# Patient Record
Sex: Female | Born: 1945
Health system: Southern US, Community
[De-identification: ages and names within clinical notes are randomized; demographics above are authoritative.]

## PROBLEM LIST (undated history)

## (undated) DIAGNOSIS — K219 Gastro-esophageal reflux disease without esophagitis: Secondary | ICD-10-CM

## (undated) HISTORY — PX: ADENOIDECTOMY: SUR15

## (undated) HISTORY — PX: TONSILLECTOMY: SUR1361

## (undated) HISTORY — DX: Gastro-esophageal reflux disease without esophagitis: K21.9

---

## 1998-01-16 ENCOUNTER — Ambulatory Visit: Admission: RE | Admit: 1998-01-16 | Discharge: 1998-01-16 | Payer: Self-pay | Admitting: Family Medicine

## 1999-05-06 ENCOUNTER — Other Ambulatory Visit: Admission: RE | Admit: 1999-05-06 | Discharge: 1999-05-06 | Payer: Self-pay | Admitting: Family Medicine

## 2000-07-05 ENCOUNTER — Encounter: Payer: Self-pay | Admitting: Family Medicine

## 2000-07-05 ENCOUNTER — Encounter: Admission: RE | Admit: 2000-07-05 | Discharge: 2000-07-05 | Payer: Self-pay | Admitting: Family Medicine

## 2000-07-19 ENCOUNTER — Other Ambulatory Visit: Admission: RE | Admit: 2000-07-19 | Discharge: 2000-07-19 | Payer: Self-pay | Admitting: Family Medicine

## 2000-08-17 ENCOUNTER — Ambulatory Visit (HOSPITAL_COMMUNITY): Admission: RE | Admit: 2000-08-17 | Discharge: 2000-08-17 | Payer: Self-pay | Admitting: *Deleted

## 2000-08-18 ENCOUNTER — Ambulatory Visit (HOSPITAL_COMMUNITY): Admission: RE | Admit: 2000-08-18 | Discharge: 2000-08-18 | Payer: Self-pay | Admitting: *Deleted

## 2004-01-01 ENCOUNTER — Ambulatory Visit (HOSPITAL_COMMUNITY): Admission: RE | Admit: 2004-01-01 | Discharge: 2004-01-01 | Payer: Self-pay | Admitting: Internal Medicine

## 2005-01-06 ENCOUNTER — Ambulatory Visit: Payer: Self-pay | Admitting: Internal Medicine

## 2005-03-17 ENCOUNTER — Ambulatory Visit: Payer: Self-pay | Admitting: Internal Medicine

## 2005-04-02 ENCOUNTER — Ambulatory Visit: Payer: Self-pay | Admitting: Internal Medicine

## 2005-04-19 ENCOUNTER — Ambulatory Visit: Payer: Self-pay | Admitting: Internal Medicine

## 2006-02-10 ENCOUNTER — Ambulatory Visit: Payer: Self-pay | Admitting: Internal Medicine

## 2006-05-13 ENCOUNTER — Ambulatory Visit: Payer: Self-pay | Admitting: Internal Medicine

## 2006-08-11 ENCOUNTER — Ambulatory Visit: Payer: Self-pay | Admitting: Internal Medicine

## 2006-09-02 ENCOUNTER — Ambulatory Visit: Payer: Self-pay | Admitting: Internal Medicine

## 2007-04-21 ENCOUNTER — Ambulatory Visit: Payer: Self-pay | Admitting: Internal Medicine

## 2007-06-22 DIAGNOSIS — K219 Gastro-esophageal reflux disease without esophagitis: Secondary | ICD-10-CM | POA: Insufficient documentation

## 2007-06-22 DIAGNOSIS — J4550 Severe persistent asthma, uncomplicated: Secondary | ICD-10-CM | POA: Insufficient documentation

## 2007-06-23 ENCOUNTER — Ambulatory Visit: Payer: Self-pay | Admitting: Internal Medicine

## 2007-07-11 ENCOUNTER — Other Ambulatory Visit: Admission: RE | Admit: 2007-07-11 | Discharge: 2007-07-11 | Payer: Self-pay | Admitting: Family Medicine

## 2007-07-19 ENCOUNTER — Encounter: Admission: RE | Admit: 2007-07-19 | Discharge: 2007-07-19 | Payer: Self-pay | Admitting: Family Medicine

## 2007-08-11 ENCOUNTER — Ambulatory Visit: Payer: Self-pay | Admitting: Internal Medicine

## 2007-08-11 ENCOUNTER — Telehealth (INDEPENDENT_AMBULATORY_CARE_PROVIDER_SITE_OTHER): Payer: Self-pay | Admitting: *Deleted

## 2007-08-18 ENCOUNTER — Encounter: Admission: RE | Admit: 2007-08-18 | Discharge: 2007-08-18 | Payer: Self-pay | Admitting: Family Medicine

## 2008-07-29 ENCOUNTER — Ambulatory Visit: Payer: Self-pay | Admitting: Internal Medicine

## 2009-09-03 ENCOUNTER — Ambulatory Visit: Payer: Self-pay | Admitting: Internal Medicine

## 2010-01-15 ENCOUNTER — Telehealth (INDEPENDENT_AMBULATORY_CARE_PROVIDER_SITE_OTHER): Payer: Self-pay | Admitting: *Deleted

## 2010-02-02 ENCOUNTER — Telehealth (INDEPENDENT_AMBULATORY_CARE_PROVIDER_SITE_OTHER): Payer: Self-pay | Admitting: *Deleted

## 2010-02-06 ENCOUNTER — Telehealth (INDEPENDENT_AMBULATORY_CARE_PROVIDER_SITE_OTHER): Payer: Self-pay | Admitting: *Deleted

## 2010-09-22 NOTE — Miscellaneous (Signed)
Summary: Orders Update pft charges  Clinical Lists Changes  Orders: Added new Service order of Carbon Monoxide diffusing w/capacity (94720) - Signed Added new Service order of Lung Volumes (94240) - Signed Added new Service order of Spirometry (Pre & Post) (94060) - Signed 

## 2010-09-22 NOTE — Progress Notes (Signed)
Summary: RX LARIFICATION  Phone Note Call from Patient   Caller: NANCY @ [facility] [address] Call For: WERT Summary of Call: RX IS FOR XOPENEX AND SYMBICORT. DOES PT NEED BOTH OF THESE? [phone]. CHART READS TO WERT SCHED 12/19. Initial call taken by: Tivis Ringer,  August 11, 2007 11:18 AM  Follow-up for Phone Call        MW rx both Symbicort and xopenex.  Symbicort is a maintance med and the xopenex is for relief purposes only.  Called pharmacy spoke with pharmacist made aware. Follow-up by: Cloyde Reams RN,  August 11, 2007 11:28 AM

## 2010-09-22 NOTE — Progress Notes (Signed)
Summary: prescript  Phone Note Call from Patient   Caller: Patient Call For: wert Summary of Call: need prescript for xopenex called [facility] [address] Initial call taken by: Rickard Patience,  February 06, 2010 3:59 PM  Follow-up for Phone Call        Rx was sent to pharm.  Spoke with pt and advised this was done. Follow-up by: Vernie Murders,  February 06, 2010 4:05 PM    Prescriptions: Pauline Aus HFA 45 MCG/ACT  AERO (LEVALBUTEROL TARTRATE) 2 puffs every 4h as needed  #1 x 3   Entered by:   Vernie Murders   Authorized by:   Nyoka Cowden MD   Signed by:   Vernie Murders on 02/06/2010   Method used:   Electronically to        [facility]  [address] [phone]* (retail)       [address]       [place], [place]  [address]       Ph: [phone] or [phone]       Fax: [phone]   RxID:   [id number]

## 2010-09-22 NOTE — Assessment & Plan Note (Signed)
Summary: Pulmonary/ acute ext ov with hfa teaching   Primary Provider/Referring Provider:  Dr. Ocie Bob  CC:  Yearly followup.  Pt c/o increased SOB and prod cough with yellow sputum x 5 days.  Pt denies any other complaints today.Marland Kitchen  History of Present Illness: 65 year old white female with severe chronic asthma with an irreversible component on her best days has an FEV1 of approximately 1.2.    12/7/9  ov for yearly reeval with no cough or increased dyspnea over baseline.    September 03, 2009  Yearly followup.  Pt c/o increased SOB and prod cough with yellow sputum x 5 days.  was maintaining nicely on twice daily symbicort and qvar but exposed to sick grandchildren started with head cold > chest > increased saba to every 4-6 hours which is a marked change.  Pt denies any significant sore throat, dysphagia, itching, sneezing,  fever, chills, sweats, unintended wt loss, pleuritic or exertional cp, hempoptysis, change in activity tolerance  orthopnea pnd or leg swelling.    Current Medications (verified): 1)  Symbicort 160-4.5 Mcg/act  Aero (Budesonide-Formoterol Fumarate) .... Two Puffs Twice A Day 2)  Qvar 80 Mcg/act  Aers (Beclomethasone Dipropionate) .... Two Puffs Twice A Day 3)  Multivitamins   Tabs (Multiple Vitamin) .... Once Daily 4)  Calcium 600 Mg .... Once Daily 5)  Niacin Cr 250 Mg  Cpcr (Niacin) .... Once Daily 6)  Singulair 10 Mg  Tabs (Montelukast Sodium) .... Once Daily 7)  Nexium 40 Mg  Cpdr (Esomeprazole Magnesium) .... Once Daily 8)  New Phase .... As Directed 9)  Xopenex Hfa 45 Mcg/act  Aero (Levalbuterol Tartrate) .... 2 Puffs Every 4h As Needed 10)  Robitussin Dm 100-10 Mg/57ml Syrp (Dextromethorphan-Guaifenesin) .... Per Bottle Directions As Needed  Allergies (verified): 1)  ! Pcn 2)  ! Sulfa  Past History:  Past Medical History:  GASTROESOPHAGEAL REFLUX DISEASE (ICD-530.81) ASTHMA (ICD-493.90)     - pft's 08/11/2007  FEV1 1.15 ratio 43% with 13% improvement  after bronchodilators and nl DLC0     - nl alpha1AT 05/13/2006     - HFA 75% September 03, 2009  Medication calendar completed 8/06  Vital Signs:  Patient profile:   65 year old female Weight:      173.13 pounds BMI:     31.78 O2 Sat:      97 % on Room air Temp:     97.6 degrees F oral Pulse rate:   79 / minute BP sitting:   122 / 80  (left arm)  Vitals Entered By: Vernie Murders (September 03, 2009 1:50 PM)  O2 Flow:  Room air  Physical Exam  Additional Exam:  wt 175 > 173 September 03, 2009  Ambulatory healthy appearing in no acute distress. Afeb with normal vital signs HEENT: nl dentition, turbinates, and orophanx. Nl external ear canals without cough reflex Neck without JVD/Nodes/TM Lungs slightly diminished breath sounds with increased expiratory time and bilateral trace mid exp  wheezes. RRR no s3 or murmur or increase in P2 Abd soft and benign with nl excursion in the supine position. No bruits or organomegaly Ext warm without calf tenderness, cyanosis clubbing or edema Skin warm and dry without lesions     Impression & Recommendations:  Problem # 1:  ASTHMA (ICD-493.90) Mild flare in setting of uri - her chronic maint rx seems quite appropriate.   Each maintenance medication was reviewed in detail including most importantly the difference between maintenance and as needed and under  what circumstances the prns are to be used. See instructions for specific recommendations   I spent extra time with the patient today explaining optimal mdi  technique.  This improved from  50-75%  Medications Added to Medication List This Visit: 1)  Nexium 40 Mg Cpdr (Esomeprazole magnesium) .... Take  one 30-60 min before first meal of the day 2)  Robitussin Dm 100-10 Mg/87ml Syrp (Dextromethorphan-guaifenesin) .... Per bottle directions as needed 3)  Prednisone 10 Mg Tabs (Prednisone) .... 4 each am x 2days, 2x2days, 1x2days and stop 4)  Doxycycline Hyclate 100 Mg Tabs (Doxycycline hyclate)  .... One twice daily before eating  Other Orders: Est. Patient Level IV (81191)  Patient Instructions: 1)  Work on inhaler technique:  relax and blow all the way out then take a nice smooth deep breath back in, triggering the inhaler at same time you start breathing in 2)  doxy x 7 days should turn the mucus back to clear 3)  Prednisone 10 mg 4 each am x 2days, 2x2days, 1x2days and stop should improve your breathing  back to where it was and reuslt in less need for rescue therapy with xopenex 4)  If your breathing worsens or you need to use your rescue inhaler more than twice weekly or wake up more than twice a month with any respiratory symptoms or require more than two rescue inhalers per year, we need to see you right away. 5)    Prescriptions: DOXYCYCLINE HYCLATE 100 MG TABS (DOXYCYCLINE HYCLATE) one twice daily before eating  #14 x 0   Entered and Authorized by:   Nyoka Cowden MD   Signed by:   Nyoka Cowden MD on 09/03/2009   Method used:   Electronically to        [facility] [phone]* (retail)       [address]       [place], [place]  [address]       Ph: [phone] or [phone]       Fax: [phone]   RxID:   [id number] PREDNISONE 10 MG  TABS (PREDNISONE) 4 each am x 2days, 2x2days, 1x2days and stop  #14 x 0   Entered and Authorized by:   Nyoka Cowden MD   Signed by:   Nyoka Cowden MD on 09/03/2009   Method used:   Electronically to        [facility]  [address] [phone]* (retail)       [address]       [place], [place]  [address]       Ph: [phone] or [phone]       Fax: [phone]   RxID:   [id number]

## 2010-09-22 NOTE — Assessment & Plan Note (Signed)
Summary: 6 WEEKS/APC   Chief Complaint:  Pt here for rov with PFT's She has no new complaints..  History of Present Illness: 65 year old white female with severe chronic asthma with an irreversible component two on her best days has an FEV1 of approximately 1.2.  She returns today as requested for PFTs stating that she has had no increase in dyspnea over baseline. Pt denies any significant sore throat, nasal congestion or excess secretions, fever, chills, sweats, unintended wt loss, ex cp, orthopnea pnd or leg swelling. Pt denies any increase in rescue therapy over baseline, denies waking up needing it or having early am exacerbations of coughing/wheezing/ or dyspnea.     Current Allergies: ! PCN ! SULFA  Past Medical History:    Current Problems:     GASTROESOPHAGEAL REFLUX DISEASE (ICD-530.81)    ASTHMA (ICD-493.90)     see pft's 08/11/2007     nl alpha1AT 05/13/2006              Vital Signs:  Patient Profile:   65 Years Old Female Height:     62 inches Weight:      178 pounds O2 Sat:      97 % Temp:     99.5 degrees F oral Pulse rate:   77 / minute Oxygen therapy Room Air                 Physical Exam  Ambulatory bf in nad mild turbinate edema.  Dentures in place. Oropharynx no thrush or excess pnd or cobblestoning.  No JVD or cervical adenopathy. Mild accessory muscle hypertrophy. Trachea midline, nl thryroid. Chest was hyperinflated by percussion with diminished breath sounds and moderate increased exp time without wheeze. Hoover sign positive at mid inspiration. Regular rate and rhythm without murmur gallop or rub or increase P2.  Abd: no hsm, nl excursion. Ext warm without C,C or E.       Problem # 1:  ASTHMA (ICD-493.90) I had an extended discussion with the patient today lasting 15 to 20 minutes of a 25 minute visit on the following issues:   First I reviewed with her her PFTs from today to indicate she still has severe focal structure with no  significant improvement in her baseline FEV1.  This puts her at risk of flareups although does not appear to interfere with her ADLs, perhaps because she has learned to pace herself.  She also has become somewhat complacent in terms of optimal inhaler technique, in fact, has regressed in terms her of her ability to use these effectively to my chagrin.  I spent her time with the patient today explaining optimal mdi  technique.  This improved from 25 to 75% effective.  I also emphasized the 3-5 day rule for exacerbations and offered to see her here for any flare ups to avoid trips to er or unnecessary work absence (she works nights so daytime visits before or after work should solve this problem if she'll just pay more attention to her symptoms and her response or need for rescue therapy.  Problem # 2:  GASTROESOPHAGEAL REFLUX DISEASE (ICD-530.81)  Her updated medication list for this problem includes:    Nexium 40 Mg Cpdr (Esomeprazole magnesium) .Marland Kitchen... Take  one 30-60 min before first meal of the day    Medications Added to Medication List This Visit: 1)  New Phase  .... As directed 2)  Xopenex Hfa 45 Mcg/act Aero (Levalbuterol tartrate) .... 2 puffs every 4h as needed   Patient  Instructions: 1)  Please schedule a follow-up appointment in 3 months. 2)  Remember to trigger inhaler immediately when you start to breath in and continue to breath slow and deep   Prescriptions: XOPENEX HFA 45 MCG/ACT  AERO (LEVALBUTEROL TARTRATE) 2 puffs every 4h as needed  #1 x 11   Entered and Authorized by:   Nyoka Cowden MD   Signed by:   Nyoka Cowden MD on 08/11/2007   Method used:   Electronically sent to ...       [facility] [address] [phone]*       [address]       [place], [place]  [address]       Ph: [phone] or [phone]       Fax: [phone]   RxID:   [id number] QVAR 80 MCG/ACT  AERS (BECLOMETHASONE DIPROPIONATE) TWO PUFFS TWICE A DAY   #1 x 11   Entered and Authorized by:   Nyoka Cowden MD   Signed by:   Nyoka Cowden MD on 08/11/2007   Method used:   Electronically sent to ...       [facility] [address] [phone]*       [address]       [place], [place]  [address]       Ph: [phone] or [phone]       Fax: [phone]   RxID:   [id number] SYMBICORT 160-4.5 MCG/ACT  AERO (BUDESONIDE-FORMOTEROL FUMARATE) TWO PUFFS TWICE A DAY  #1 x 12   Entered and Authorized by:   Nyoka Cowden MD   Signed by:   Nyoka Cowden MD on 08/11/2007   Method used:   Electronically sent to ...       [facility] [address] [phone]*       [address]       [place], [place]  [address]       Ph: [phone] or [phone]       Fax: [phone]   RxID:   [id number]  ]

## 2010-09-22 NOTE — Progress Notes (Signed)
  Phone Note Other Incoming   Request: Send information Summary of Call: Request for records received from [facility]. Request forwarded to [facility].     

## 2010-09-22 NOTE — Assessment & Plan Note (Signed)
Summary: Pulmonary/ yearly fu asthma   PCP:  Dr. Ocie Bob  Chief Complaint:  Pt returns for followup.  She was last seen 08-11-07.  Breathing overall okay.  No complaints today.Suzanne Harvey  History of Present Illness: 65 year old white female with severe chronic asthma with an irreversible component on her best days has an FEV1 of approximately 1.2.    12/7/9  ov for yearly reeval with no cough or increased dyspnea over baseline.  Pt denies any significant sore throat, nasal congestion or excess secretions, fever, chills, sweats, unintended wt loss, pleuritic or exertional cp, orthopnea pnd or leg swelling.  Pt also denies any obvious fluctuation in symptoms with weather or environmental change or other alleviating or aggravating factors.     Pt denies any increase in rescue therapy over baseline, denies waking up needing it or having early am exacerbations of coughing/wheezing/ or dyspnea      Updated Prior Medication List: SYMBICORT 160-4.5 MCG/ACT  AERO (BUDESONIDE-FORMOTEROL FUMARATE) TWO PUFFS TWICE A DAY QVAR 80 MCG/ACT  AERS (BECLOMETHASONE DIPROPIONATE) TWO PUFFS TWICE A DAY MULTIVITAMINS   TABS (MULTIPLE VITAMIN) once daily * CALCIUM 600 MG ONCE DAILY NIACIN CR 250 MG  CPCR (NIACIN) once daily SINGULAIR 10 MG  TABS (MONTELUKAST SODIUM) once daily NEXIUM 40 MG  CPDR (ESOMEPRAZOLE MAGNESIUM) once daily * NEW PHASE as directed XOPENEX HFA 45 MCG/ACT  AERO (LEVALBUTEROL TARTRATE) 2 puffs every 4h as needed  Current Allergies (reviewed today): ! PCN ! SULFA  Past Medical History:     GASTROESOPHAGEAL REFLUX DISEASE (ICD-530.81)    ASTHMA (ICD-493.90)        - pft's 08/11/2007  FEV1 1.15 ratio 43% with 13% improvement after bronchodilators and nl DLC0        - nl alpha1AT 05/13/2006    Medication calendar completed 8/06               Family History:    ht dz father    cva father    abd ca mother    asthma 2 children  Social History:    never smoker    Review of  Systems  The patient denies anorexia, fever, weight loss, weight gain, vision loss, decreased hearing, hoarseness, chest pain, syncope, peripheral edema, prolonged cough, headaches, hemoptysis, abdominal pain, melena, hematochezia, severe indigestion/heartburn, hematuria, incontinence, muscle weakness, suspicious skin lesions, transient blindness, difficulty walking, depression, unusual weight change, abnormal bleeding, enlarged lymph nodes, and angioedema.     Vital Signs:  Patient Profile:   65 Years Old Female Height:     62 inches Weight:      175 pounds O2 Sat:      98 % O2 treatment:    Room Air Temp:     98.1 degrees F oral Pulse rate:   66 / minute BP sitting:   132 / 74  (left arm)  Vitals Entered By: Vernie Murders (July 29, 2008 2:25 PM)                 Physical Exam  wt 178 > 175 Ambulatory healthy appearing in no acute distress. Afeb with normal vital signs HEENT: nl dentition, turbinates, and orophanx. Nl external ear canals without cough reflex Neck without JVD/Nodes/TM Lungs slightly diminished breath sounds with increased expiratory time but no true wheezes. RRR no s3 or murmur or increase in P2 Abd soft and benign with nl excursion in the supine position. No bruits or organomegaly Ext warm without calf tenderness, cyanosis clubbing or edema Skin warm  and dry without lesions        Problem # 1:  ASTHMA (ICD-493.90) I spent extra time with the patient today explaining optimal mdi  technique.  This improved from  50-75%  All goals of asthma met including optimal function and elimination of symptoms with minimum need for rescue therapy. Contingencies discussed today including the rule of two's.    Each maintenance medication was reviewed in detail including most importantly the difference between maintenance and as needed and under what circumstances the prns are to be used.  Her updated medication list for this problem includes:    Symbicort 160-4.5  Mcg/act Aero (Budesonide-formoterol fumarate) .Suzanne Harvey..Suzanne Harvey Two puffs twice a day    Qvar 80 Mcg/act Aers (Beclomethasone dipropionate) .Suzanne Harvey..Suzanne Harvey Two puffs twice a day    Singulair 10 Mg Tabs (Montelukast sodium) ..... Once daily    Xopenex Hfa 45 Mcg/act Aero (Levalbuterol tartrate) .Suzanne Harvey... 2 puffs every 4h as needed  Orders: Est. Patient Level IV ([address])   Medications Added to Medication List This Visit: 1)  Symbicort 160-4.5 Mcg/act Aero (Budesonide-formoterol fumarate) .... Two puffs twice a day 2)  Qvar 80 Mcg/act Aers (Beclomethasone dipropionate) .... Two puffs twice a day  Complete Medication List: 1)  Symbicort 160-4.5 Mcg/act Aero (Budesonide-formoterol fumarate) .... Two puffs twice a day 2)  Qvar 80 Mcg/act Aers (Beclomethasone dipropionate) .... Two puffs twice a day 3)  Multivitamins Tabs (Multiple vitamin) .... Once daily 4)  Calcium 600 Mg  .... Once daily 5)  Niacin Cr 250 Mg Cpcr (Niacin) .... Once daily 6)  Singulair 10 Mg Tabs (Montelukast sodium) .... Once daily 7)  Nexium 40 Mg Cpdr (Esomeprazole magnesium) .... Once daily 8)  New Phase  .... As directed 9)  Xopenex Hfa 45 Mcg/act Aero (Levalbuterol tartrate) .... 2 puffs every 4h as needed   Patient Instructions: 1)  work on inhaler technique smooth and deep 2)  If your breathing worsens or you need to use your rescue inhaler(xopenox)  more than twice weekly or wake up more than twice a month with any respiratory symptoms or require more than two rescue inhalers per year, we need to see you right away. 3)  otherwise we will see you in one year for refills 4)      Prescriptions: QVAR 80 MCG/ACT  AERS (BECLOMETHASONE DIPROPIONATE) TWO PUFFS TWICE A DAY  #1 x 11   Entered and Authorized by:   Nyoka Cowden MD   Signed by:   Nyoka Cowden MD on 07/29/2008   Method used:   Electronically to        [facility]  [address] [phone]* (retail)       [address]       [place], [place]   [address]       Ph: [phone] or [phone]       Fax: [phone]   RxID:   [id number] SYMBICORT 160-4.5 MCG/ACT  AERO (BUDESONIDE-FORMOTEROL FUMARATE) TWO PUFFS TWICE A DAY  #1 x 11   Entered and Authorized by:   Nyoka Cowden MD   Signed by:   Nyoka Cowden MD on 07/29/2008   Method used:   Electronically to        [facility]  [address] [phone]* (retail)       [address]       [place], [place]  [address]  Ph: [phone] or [phone]       Fax: [phone]   RxID:   [id number]  ]

## 2010-09-22 NOTE — Progress Notes (Signed)
  Phone Note Other Incoming   Request: Send information Summary of Call: Request for records received from [facility]. Request forwarded to [facility].      Appended Document:  signed in error

## 2010-10-30 ENCOUNTER — Ambulatory Visit
Admission: RE | Admit: 2010-10-30 | Discharge: 2010-10-30 | Disposition: A | Discharge status: Home / Self Care | Payer: BC Managed Care – PPO | Source: Ambulatory Visit | Attending: Rheumatology | Admitting: Rheumatology

## 2010-10-30 ENCOUNTER — Other Ambulatory Visit: Payer: Self-pay | Admitting: Rheumatology

## 2010-10-30 DIAGNOSIS — M545 Low back pain, unspecified: Secondary | ICD-10-CM

## 2010-11-25 ENCOUNTER — Other Ambulatory Visit: Payer: Self-pay | Admitting: Rheumatology

## 2010-11-25 DIAGNOSIS — M545 Low back pain, unspecified: Secondary | ICD-10-CM

## 2010-11-26 ENCOUNTER — Ambulatory Visit
Admission: RE | Admit: 2010-11-26 | Discharge: 2010-11-26 | Disposition: A | Discharge status: Home / Self Care | Payer: BC Managed Care – PPO | Source: Ambulatory Visit | Attending: Rheumatology | Admitting: Rheumatology

## 2010-11-26 DIAGNOSIS — M545 Low back pain, unspecified: Secondary | ICD-10-CM

## 2011-01-05 NOTE — Assessment & Plan Note (Signed)
[facility]                             PULMONARY OFFICE NOTE   Suzanne Harvey, Suzanne Harvey                        MRN:          [id number]  DATE:06/23/2007                            DOB:          06-25-1946    PULMONARY-EXTENDED FOLLOWUP OFFICE VISIT:  A 65 year old black female  with chronic asthma stating that she has had difficulty with increasing  dyspnea with exertion over the last several weeks.  She denies any  nocturnal episodes of dyspnea, fever, chills, sweats, chest pain, overt  sinus or reflux symptoms.   For full calendar of medications, please see face sheet dated June 23, 2007.  Note that I did not realize that she has her schedule  backwards and that she sleeps in the morning and wakes up around the  afternoon or evening to go to work.  Therefore, the medication calendar  she has is not relevant based on the times of day the medicines are  indicated; however, they are inventoried on our face sheet dated June 23, 2007.   PHYSICAL EXAMINATION:  GENERAL:  She is a pleasant black female in no  acute distress.  VITAL SIGNS:  Stable.  HEENT:  Unremarkable.  Oropharynx clear with no thrush.  NECK:  Supple without cervical adenopathy or tenderness.  Trachea is  midline.  LUNGS:.  Lung fields reveal diminished breath sounds with relatively  short inspiratory time.  CARDIAC:  Regular rate and rhythm without murmur, gallop, or rub.  ABDOMEN:  Soft, nontender, nondistended.  EXTREMITIES:  Warm without calf tenderness, cyanosis, clubbing, or  edema.   IMPRESSION:  Chronic asthma with an irreversible component.  I found out  today that she actually is no longer inhaling with 75% effectiveness her  MDIs.  I spent almost 30 minutes with her in coaching and was surprised  that she has regressed, but I suspect it is because tends to  panic  and not relax all the way to residual volume before attempting to do an  inspiratory capacity.  It may  well be, therefore, that we need to switch  her over to nebulizers if this continues to be an issue.   For now, I am going to leave her on the Symbicort 160/4.5 two puffs  b.i.d. and Qvar 80 two puffs b.i.d.  She will still have Symbicort but  change the headings on her medication calendar to reflect the times of  day she actually takes her medicines.  (The columns at the top now read  when waking in the morning and at bedtime rather than the time of  day.)   In addition, I found she was due a booster for Pneumovax and gave this  to her today.  She declined the flu vaccination, however.   Followup will be in 6 weeks, sooner if needed.     Charlaine Dalton. Sherene Sires, MD, [facility]  Electronically Signed    MBW/MedQ  DD: 06/23/2007  DT: 06/24/2007  Job #: [id number]   cc:   Renaye Rakers, M.D.

## 2011-01-05 NOTE — Assessment & Plan Note (Signed)
HEALTHCARE                             PULMONARY OFFICE NOTE   Suzanne Harvey, Suzanne Harvey                        MRN:          [id number]  DATE:04/21/2007                            DOB:          Aug 05, 1946    HISTORY OF PRESENT ILLNESS:  A delightful 65 year old black female with  severe chronic asthma with a fixed airflow obstruction component, doing  a little bit worse in hot weather. She has noticed that she actually has  no symptoms at night but after she stirs around and gets busy in the  morning, she begins having symptoms and waits until then to use her  inhalers. She also has been using albuterol at least twice daily, since  the weather turned hot that is for the last several months). She denies  any exertional chest pain, orthopnea, PND, purulent sputum, overt sinus  and reflux symptoms.   MEDICATIONS:  Her present medications were reviewed with her in detail  using the medication calendar format, which corresponds to the  medication face sheet, dated April 21, 2007.   PHYSICAL EXAMINATION:  GENERAL:  An obese, pleasant, ambulatory, black  female in no acute distress.  VITAL SIGNS:  Afebrile with stable vital signs.  HEENT:  Reveals mild extremity edema. Oropharynx clear.  NECK:  Without cervical adenopathy or tenderness. Trachea is midline. No  organomegaly.  LUNGS:  Reveal diminished breath sounds without wheezing.  HEART:  Regular rate and rhythm. Without murmur, rub, or increase in P-  tube.  ABDOMEN:  Soft, benign, but obese.  EXTREMITIES:  Warm without calf tenderness, cyanosis, or clubbing.  VITAL SIGNS:  O2 saturation 98% on room air.   LABORATORY DATA:  Most recent spirometry was from a year ago and  indicates and FEV1 of 49% predicted with a ratio of 46% and no  improvement after bronchodilators.  MDI technique is reviewed and is only about 50% effective to my  surprise. Her problem is, she fails to relax to residual volume  before  triggering at the beginning of inspiration.   ASSESSMENT:  I spent an extra 15 to 20 minutes at a 25 minute visit,  therefore on the following strategy.  1. First, I made sure that she could use her MDI more effectively,      though we never got past the 75% level with her, even with multiple      attempts, because of her relatively short inspiratory time.  2. I emphasized to her that she should not wait until she is short of      breath to take the Symbicort, as it is a maintenance inhaler. Since      it works so effectively, she should probably use it about the time      her feet hit the floor in the morning, (therefore, she should leave      it by her night stand) 2 puffs before she becomes short of breath,      with relatively short inspiratory time. Will get better      distribution of that drug to target and then  when she uses the Qvar      later during her routine morning medications, she will get better      penetration with the Qvar as well with a target dose.   She seemed very happy with this approach and I gave her samples of the  Symbicort and asked to see her back in 3 months for followup PFT's and a  yearly chest x-ray.     Charlaine Dalton. Sherene Sires, MD, [facility]  Electronically Signed    MBW/MedQ  DD: 04/21/2007  DT: 04/23/2007  Job #: [id number]   cc:   Renaye Rakers, M.D.

## 2011-01-08 NOTE — Assessment & Plan Note (Signed)
[facility]                               PULMONARY OFFICE NOTE   Suzanne, Harvey                        MRN:          [id number]  DATE:05/13/2006                            DOB:          1946/01/07    The patient is a 65 year old black female who carries a diagnosis of  lifelong asthma associated with sinusitis for which she has been followed by  Dr. Pollyann Kennedy.  She comes in today stating that she had a tough summer with the  hot weather, but now that it is cooler she is feeling better.  She has no  significant limitation in terms of activities of daily living but is not  aerobically active.  She denies any nocturnal cough or wheeze or dyspnea.   For full full list of medications please see face sheet dated 05/14/2006  that corresponds 100% to her present medication calendar.   PHYSICAL EXAMINATION:  She is a pleasant black female in no acute distress.  VITAL SIGNS:  Afebrile.  HEENT:  Unremarkable. Pharynx clear.  LUNGS:  Fields are completely clear bilaterally to auscultation and  percussion.  CARDIAC:  Regular rate and rhythm without murmurs, gallops or rubs.  ABDOMEN:  Soft, benign.  EXTREMITIES:  Warm without calf tenderness, cyanosis, clubbing or edema.   PFTs were performed and to my surprise revealed further reduction in FEV1  down to 1.06 now with no improvement after bronchodilators.   Chart review indicates progressive decline in FEV1 despite since she was  first seen here in 1998 with more of a pattern of COPD than asthma.  I am  therefore going to recommend and alpha 1 antitrypsin level and entertain the  possibility of changing her regimen to add QVAR to reach the lower airways.  It will be difficult to motivate her to use additional medication since she  remains so asymptomatic and is already on a complex regimen.   PLAN:  Check the alpha 1 trypsin level.  If negative, add QVAR as the next  step.  The patient will be followed  every 3 months either way.                                   Suzanne Harvey. Suzanne Sires, MD, [facility]   MBW/MedQ  DD:  05/14/2006  DT:  05/17/2006  Job #:  [id number]   cc:   Renaye Rakers, M.D.

## 2011-01-08 NOTE — Assessment & Plan Note (Signed)
[facility]                             PULMONARY OFFICE NOTE   RAELAN, BURGOON                          MRN:          [id number]  DATE:09/02/2006                            DOB:          [birth year]    HISTORY OF PRESENT ILLNESS:  This is a 65 year old African-American  female patient of Dr. Thurston Hole. Has a known history of chronic asthma and  returns for a two week followup. Last visit patient was having a flare  of asthmatic symptoms despite being on Advair 250/50 twice daily. The  patient was switched over to Symbicort 160/4.5 two puffs twice daily and  Qvar 80 two puffs twice daily. Patient was given a prednisone taper.  Patient returns today reporting that she is substantially improved with  decreased wheezing and shortness of breath. The patient has decreased  her albuterol rescue use as well. The patient has brought all of her  medications in today for review which are correct with our medication  list. The patient denies any chest pain, shortness of breath or wheezing  or nocturnal symptoms.   PAST MEDICAL HISTORY:  Reviewed.   CURRENT MEDICATIONS:  Reviewed.   PHYSICAL EXAMINATION:  GENERAL:  The patient is a pleasant female in no  acute distress.  VITAL SIGNS:  She is afebrile with stable vital signs. O2 saturations  96% on room air.  HEENT:  Unremarkable.  NECK:  Supple without adenopathy.  LUNGS:  Lung sounds are clear to auscultation bilaterally without any  wheezing or crackles.  CARDIAC:  Regular rate and rhythm.  ABDOMEN:  Soft and nontender.  EXTREMITIES:  Warm without any edema.   IMPRESSION/PLAN:  1. Chronic asthma currently well-compensated on her new regimen of      Symbicort 160/4.5 two puffs twice daily and Qvar 80 two puffs twice      daily. The patient will continue present regimen. Follow back up      with Dr. Sherene Sires in 6-8 weeks or sooner if needed.  2. Gastroesophageal reflux. Patient is well-compensated on Nexium     daily.  3. Complex medication regimen. Patient medications reviewed in detail.      Patient education      was provided. The patient's computerized medication calendar was      adjusted accordingly and reviewed with patient.      Rubye Oaks, NP  Electronically Signed      Charlaine Dalton. Sherene Sires, MD, [facility]  Electronically Signed   TP/MedQ  DD: 09/05/2006  DT: 09/05/2006  Job #: [id number]

## 2011-01-08 NOTE — Assessment & Plan Note (Signed)
[facility]                             PULMONARY OFFICE NOTE   Suzanne Harvey, Suzanne Harvey                        MRN:          [id number]  DATE:08/11/2006                            DOB:          January 23, 1946    HISTORY:  A 65 year old black female with severe chronic asthma with  irreversible component and a normal alpha 1 antitrypsin levels done in  the past therefore attributed to chronic remodeling. She is usually  maintained on Advair 250/50 b.i.d. with minimum use for albuterol but  over the last month has had increasing symptoms of dyspnea associated  with chest tightness and a dry cough. She does note mild nasal  congestion as well as a stuffy head. She denies any orthopnea, PND, or  leg swelling, purulent sputum, itching or sneezing.   She states she is worse in the cold and better with albuterol which she  took within an hour of coming to the office.   PHYSICAL EXAMINATION:  GENERAL:  She is a pleasant, ambulatory, black  female in no acute distress.  VITAL SIGNS:  She had stable vital signs.  HEENT:  Remarkable for mild to moderate turbinate edema. No evidence of  cyanosis, pallor or polyps. Oropharynx is clear.  NECK:  Supple without cervical adenopathy or tenderness. The trachea is  midline, no thyromegaly.  LUNGS:  Lung fields perfectly clear bilaterally to auscultation and  percussion.  HEART:  There is a regular rate and rhythm without murmur, gallop or  rub.  ABDOMEN:  Soft and benign.  EXTREMITIES:  Warm without calf tenderness, cyanosis, clubbing or edema.   IMPRESSION:  Severe chronic asthma with a definite flare despite Advair  associated with upper airway symptoms that could represent reflux or be  a manifestation of poorly controlled rhinitis and/or a side effect of  Advair. To sort through the differential, I recommended switching to  Symbicort 160/4.5 two puffs b.i.d. and spent extra time teaching her how  to use this. I also  added Qvar 80 two puffs b.i.d. (I was going to add  this anyway if she was not well controlled and I want to see how she  could be on her best day on this combination before switching it).   I did give her 6 days of prednisone and reviewed with her optimal use of  both albuterol and Afrin for the purpose of opening the airway so the  other medications can actually penetrate the target tissue.   I spent 15-20 minutes of a 25 minute visit discussing this patient's  care and also teaching her optimal MDI technique.     Charlaine Dalton. Sherene Sires, MD, [facility]  Electronically Signed   MBW/MedQ  DD: 08/12/2006  DT: 08/13/2006  Job #: [id number]

## 2011-03-19 ENCOUNTER — Other Ambulatory Visit: Payer: Self-pay | Admitting: Internal Medicine

## 2011-04-06 ENCOUNTER — Encounter: Payer: Self-pay | Admitting: Internal Medicine

## 2011-04-07 ENCOUNTER — Ambulatory Visit (INDEPENDENT_AMBULATORY_CARE_PROVIDER_SITE_OTHER): Payer: BC Managed Care – PPO | Admitting: Internal Medicine

## 2011-04-07 ENCOUNTER — Encounter: Payer: Self-pay | Admitting: Internal Medicine

## 2011-04-07 VITALS — BP 126/66 | HR 71 | Temp 98.0°F | Ht 62.0 in | Wt 162.4 lb

## 2011-04-07 DIAGNOSIS — J45909 Unspecified asthma, uncomplicated: Secondary | ICD-10-CM

## 2011-04-07 NOTE — Patient Instructions (Signed)
Please schedule a follow up visit in 3 months but call sooner if needed with pft's on return  Bring the papers for your refills back to my nurse Verlon Au next week and we'll send off your refills

## 2011-04-07 NOTE — Progress Notes (Signed)
Subjective:     Patient ID: Suzanne Harvey, female   DOB: 04/27/1946, 65 y.o.   MRN: [id number]  HPI  49 yobf  with severe chronic asthma with an irreversible component on her best days has an FEV1 of approximately 1.2.   12/7/9 ov for yearly reeval with no cough or increased dyspnea over baseline.   September 03, 2009 ov cc  increased SOB and prod cough with yellow sputum x 5 days. was maintaining nicely on twice daily symbicort and qvar but exposed to sick grandchildren started with head cold > chest > increased saba to every 4-6 hours which is a marked change.   04/07/2011 f/u ov/Kostas Marrow cc yearly f/u asthma good ex tol x limited by back on qvar, symbicort and rare xopenex. Sleeping ok without nocturnal  or early am exac of resp c/o's or need for noct saba. Pt denies any significant sore throat, dysphagia, itching, sneezing,  nasal congestion or excess/ purulent secretions,  fever, chills, sweats, unintended wt loss, pleuritic or exertional cp, hempoptysis, orthopnea pnd or leg swelling.    Also denies any obvious fluctuation of symptoms with weather or environmental changes or other aggravating or alleviating factors.     :  Past Medical History:  GASTROESOPHAGEAL REFLUX DISEASE (ICD-530.81)  ASTHMA (ICD-493.90)  - pft's 08/11/2007 FEV1 1.15 ratio 43% with 13% improvement after bronchodilators and nl DLC0  - nl alpha1AT 05/13/2006  - HFA 75% September 03, 2009     Review of Systems     Objective:   Physical Exam  wt   173 September 03, 2009 >  162 04/07/2011  Ambulatory healthy appearing in no acute distress.  Afeb with normal vital signs  HEENT: nl dentition, turbinates, and orophanx. Nl external ear canals without cough reflex  Neck without JVD/Nodes/TM  Lungs slightly diminished breath sounds with increased expiratory time and no significant exp wheezes.  RRR no s3 or murmur or increase in P2  Abd soft and benign with nl excursion in the supine position. No bruits or organomegaly  Ext  warm without calf tenderness, cyanosis clubbing or edema  Skin warm and dry without lesions     Assessment:         Plan:

## 2011-04-07 NOTE — Assessment & Plan Note (Signed)
All goals of chronic asthma control met including optimal (though prob not nl due to remodeling) function and elimination of symptoms with minimal need for rescue therapy albeit on a relatively complex regimen  Contingencies discussed in full including contacting this office immediately if not controlling the symptoms using the rule of two's.

## 2011-07-06 ENCOUNTER — Telehealth: Payer: Self-pay | Admitting: Internal Medicine

## 2011-07-06 MED ORDER — MONTELUKAST SODIUM 10 MG PO TABS: 10.0000 mg | ORAL_TABLET | Freq: Every day | ORAL | Status: DC

## 2011-07-06 MED ORDER — BECLOMETHASONE DIPROPIONATE 80 MCG/ACT IN AERS: 2.0000 | INHALATION_SPRAY | Freq: Every day | RESPIRATORY_TRACT | Status: DC

## 2011-07-06 MED ORDER — BUDESONIDE-FORMOTEROL FUMARATE 160-4.5 MCG/ACT IN AERO: 2.0000 | INHALATION_SPRAY | Freq: Every day | RESPIRATORY_TRACT | Status: DC

## 2011-07-06 MED ORDER — LEVALBUTEROL TARTRATE 45 MCG/ACT IN AERO: 2.0000 | INHALATION_SPRAY | RESPIRATORY_TRACT | Status: DC | PRN

## 2011-07-06 NOTE — Telephone Encounter (Signed)
Spoke with pt. She states that she has appt with MW sched for 08/10/11 and would like for [place] to refill her inhalers and singulair to last until her appt and then when comes in wants written rxs. Rxs were sent to [facility] [address] and pt to keep planned followup for additional rx.

## 2011-08-02 ENCOUNTER — Encounter: Payer: Self-pay | Admitting: Adult Health

## 2011-08-02 ENCOUNTER — Ambulatory Visit (INDEPENDENT_AMBULATORY_CARE_PROVIDER_SITE_OTHER): Payer: BC Managed Care – PPO | Admitting: Adult Health

## 2011-08-02 VITALS — BP 132/80 | HR 83 | Temp 97.7°F | Ht 63.0 in | Wt 160.2 lb

## 2011-08-02 DIAGNOSIS — J45909 Unspecified asthma, uncomplicated: Secondary | ICD-10-CM

## 2011-08-02 MED ORDER — PREDNISONE 10 MG PO TABS: ORAL_TABLET | ORAL | Status: DC

## 2011-08-02 MED ORDER — LEVALBUTEROL HCL 0.63 MG/3ML IN NEBU
0.6300 mg | INHALATION_SOLUTION | Freq: Once | RESPIRATORY_TRACT | Status: AC
  Administered 2011-08-02: 0.63 mg via RESPIRATORY_TRACT

## 2011-08-02 MED ORDER — METHYLPREDNISOLONE ACETATE 80 MG/ML IJ SUSP
120.0000 mg | Freq: Once | INTRAMUSCULAR | Status: AC
  Administered 2011-08-02: 120 mg via INTRAMUSCULAR

## 2011-08-02 MED ORDER — AZITHROMYCIN 250 MG PO TABS: ORAL_TABLET | ORAL | Status: AC

## 2011-08-02 NOTE — Patient Instructions (Addendum)
Zpack take as directed.  Prednisone taper over next week.  Mucinex DM Twice daily  As needed  Cough/congesiton  Fluids and rest  Please contact office for sooner follow up if symptoms do not improve or worsen or seek emergency care  follow up Dr. Sherene Sires  In 1 week as planned and As needed

## 2011-08-02 NOTE — Progress Notes (Signed)
Subjective:     Patient ID: Suzanne Harvey, female   DOB: 12-27-1945, 65 y.o.   MRN: [id number]  HPI  56 yobf  with severe chronic asthma with an irreversible component on her best days has an FEV1 of approximately 1.2.   12/7/9 ov for yearly reeval with no cough or increased dyspnea over baseline.   September 03, 2009 ov cc  increased SOB and prod cough with yellow sputum x 5 days. was maintaining nicely on twice daily symbicort and qvar but exposed to sick grandchildren started with head cold > chest > increased saba to every 4-6 hours which is a marked change.   04/07/2011 f/u ov/Wert cc yearly f/u asthma good ex tol x limited by back on qvar, symbicort and rare xopenex. >>no changes   08/02/2011 Acute OV  Complains of  increased sob, productive cough with yellow phlegm, body aches, chest tightness, wheezing, x 3 days, Has had cold for last 1 week with nasal drip , cough and drainage. She was at ER with her granddaughter that was ill 3 days ago, around a lot of sick people with flu like illness. She complains since then she has increased cough, congestion and wheezing. No fever. Did have body aches initialy that have resolved. No hemopytsis or chest pain. No recent travel or abx use.    Past Medical History:  GASTROESOPHAGEAL REFLUX DISEASE (ICD-530.81)  ASTHMA (ICD-493.90)  - pft's 08/11/2007 FEV1 1.15 ratio 43% with 13% improvement after bronchodilators and nl DLC0  - nl alpha1AT 05/13/2006  - HFA 75% September 03, 2009     Review of Systems Constitutional:   No  weight loss, night sweats,  Fevers, chills, + fatigue, or  lassitude.  HEENT:   No headaches,  Difficulty swallowing,  Tooth/dental problems, or  Sore throat,                No sneezing, itching, ear ache,  +nasal congestion, post nasal drip,   CV:  No chest pain,  Orthopnea, PND, swelling in lower extremities, anasarca, dizziness, palpitations, syncope.   GI  No heartburn, indigestion, abdominal pain, nausea, vomiting,  diarrhea, change in bowel habits, loss of appetite, bloody stools.   Resp:   No coughing up of blood.   No chest wall deformity  Skin: no rash or lesions.  GU: no dysuria, change in color of urine, no urgency or frequency.  No flank pain, no hematuria   MS:  No joint pain or swelling.  No decreased range of motion.  No back pain.  Psych:  No change in mood or affect. No depression or anxiety.  No memory loss.          Objective:   Physical Exam  wt   173 September 03, 2009 >  162 04/07/2011 >160 >>160 08/02/2011 Ambulatory healthy appearing in no acute distress.  Afeb with normal vital signs  HEENT: nl dentition, turbinates, and orophanx. Nl external ear canals without cough reflex  Neck without JVD/Nodes/TM  Lungs Coarse BS w/ exp wheezing noted   RRR no s3 or murmur or increase in P2  Abd soft and benign with nl excursion in the supine position. No bruits or organomegaly  Ext warm without calf tenderness, cyanosis clubbing or edema  Skin warm and dry without lesions     Assessment:         Plan:

## 2011-08-02 NOTE — Assessment & Plan Note (Addendum)
Exacerbation with URI  xopenex neb in office  Depo medrol 120mg  IM given in office.   Plan;  Zpack take as directed.  Prednisone taper over next week.  Mucinex DM Twice daily  As needed  Cough/congesiton  Fluids and rest  Please contact office for sooner follow up if symptoms do not improve or worsen or seek emergency care

## 2011-08-04 ENCOUNTER — Ambulatory Visit (INDEPENDENT_AMBULATORY_CARE_PROVIDER_SITE_OTHER)
Admission: RE | Admit: 2011-08-04 | Discharge: 2011-08-04 | Disposition: A | Discharge status: Home / Self Care | Payer: BC Managed Care – PPO | Source: Ambulatory Visit | Attending: Adult Health | Admitting: Adult Health

## 2011-08-04 ENCOUNTER — Ambulatory Visit (INDEPENDENT_AMBULATORY_CARE_PROVIDER_SITE_OTHER): Payer: BC Managed Care – PPO | Admitting: Adult Health

## 2011-08-04 ENCOUNTER — Telehealth: Payer: Self-pay | Admitting: Adult Health

## 2011-08-04 ENCOUNTER — Encounter: Payer: Self-pay | Admitting: *Deleted

## 2011-08-04 ENCOUNTER — Encounter: Payer: Self-pay | Admitting: Adult Health

## 2011-08-04 VITALS — BP 142/78 | HR 71 | Temp 97.7°F | Ht 63.0 in | Wt 159.8 lb

## 2011-08-04 DIAGNOSIS — J45909 Unspecified asthma, uncomplicated: Secondary | ICD-10-CM

## 2011-08-04 MED ORDER — MOXIFLOXACIN HCL 400 MG PO TABS: 400.0000 mg | ORAL_TABLET | Freq: Every day | ORAL | Status: AC

## 2011-08-04 MED ORDER — METHYLPREDNISOLONE ACETATE 80 MG/ML IJ SUSP
80.0000 mg | Freq: Once | INTRAMUSCULAR | Status: AC
  Administered 2011-08-04: 80 mg via INTRAMUSCULAR

## 2011-08-04 MED ORDER — LEVALBUTEROL HCL 0.63 MG/3ML IN NEBU
0.6300 mg | INHALATION_SOLUTION | Freq: Once | RESPIRATORY_TRACT | Status: AC
  Administered 2011-08-04: 0.63 mg via RESPIRATORY_TRACT

## 2011-08-04 NOTE — Progress Notes (Signed)
Subjective:     Patient ID: Suzanne Harvey, female   DOB: 05-01-1946, 64 y.o.   MRN: [id number]  HPI 8 yobf  with severe chronic asthma with an irreversible component on her best days has an FEV1 of approximately 1.2.   12/7/9 ov for yearly reeval with no cough or increased dyspnea over baseline.   September 03, 2009 ov cc  increased SOB and prod cough with yellow sputum x 5 days. was maintaining nicely on twice daily symbicort and qvar but exposed to sick grandchildren started with head cold > chest > increased saba to every 4-6 hours which is a marked change.   04/07/2011 f/u ov/Wert cc yearly f/u asthma good ex tol x limited by back on qvar, symbicort and rare xopenex. >>no changes   08/02/2011 Acute OV  Complains of  increased sob, productive cough with yellow phlegm, body aches, chest tightness, wheezing, x 3 days, Has had cold for last 1 week with nasal drip , cough and drainage. She was at ER with her granddaughter that was ill 3 days ago, around a lot of sick people with flu like illness. She complains since then she has increased cough, congestion and wheezing. No fever. Did have body aches initialy that have resolved. No hemopytsis or chest pain. No recent travel or abx use.  >>tx w/ Zpack , steroid taper , and depo medrol shot   08/04/2011 Acute OV  Pt returns for persistent symptoms. Feeling better with decreased cough , wheezing or dyspnea however not completely better yet. She does not feel she can go back to work.  Mucus is clearing now clear to yelllow. Wheezing is decreased but not gone. Cough is a lot better.  No chest pain , n/v/d, or edema.    Past Medical History:  GASTROESOPHAGEAL REFLUX DISEASE (ICD-530.81)  ASTHMA (ICD-493.90)  - pft's 08/11/2007 FEV1 1.15 ratio 43% with 13% improvement after bronchodilators and nl DLC0  - nl alpha1AT 05/13/2006  - HFA 75% September 03, 2009     Review of Systems Constitutional:   No  weight loss, night sweats,  Fevers, chills, +  fatigue, or  lassitude.  HEENT:   No headaches,  Difficulty swallowing,  Tooth/dental problems, or  Sore throat,                No sneezing, itching, ear ache,  +nasal congestion, post nasal drip,   CV:  No chest pain,  Orthopnea, PND, swelling in lower extremities, anasarca, dizziness, palpitations, syncope.   GI  No heartburn, indigestion, abdominal pain, nausea, vomiting, diarrhea, change in bowel habits, loss of appetite, bloody stools.   Resp:   No coughing up of blood.   No chest wall deformity  Skin: no rash or lesions.  GU: no dysuria, change in color of urine, no urgency or frequency.  No flank pain, no hematuria   MS:  No joint pain or swelling.  No decreased range of motion.  No back pain.  Psych:  No change in mood or affect. No depression or anxiety.  No memory loss.          Objective:   Physical Exam  wt   173 September 03, 2009 >  162 04/07/2011 >160 >>160 08/02/2011>>159 08/04/2011  Ambulatory healthy appearing in no acute distress. Speaking in full sentences. o2 sat 100% , no accessory use  Afeb with normal vital signs  HEENT: nl dentition, turbinates, and orophanx. Nl external ear canals without cough reflex  Neck without JVD/Nodes/TM  Lungs Coarse BS w/ exp wheezing noted  -decreased from 2 days ago.  RRR no s3 or murmur or increase in P2  Abd soft and benign with nl excursion in the supine position. No bruits or organomegaly  Ext warm without calf tenderness, cyanosis clubbing or edema  Skin warm and dry without lesions     Assessment:         Plan:

## 2011-08-04 NOTE — Telephone Encounter (Signed)
Per TP: looks like early pneumonia on cxr.  Pt to finish zpak and begin avelox 400mg  1 po qd x7days, #7 with no refills.  Follow up in office on 12.14.12.  Called spoke with patient, advised of cxr results / recs.  Pt verbalized her understanding.  Will finish the zpak on 12.14.12.  avelox sent to verified pharmacy.  appt scheduled with MW 12.14.12 @ 1045.  Pt okay with this appt time.

## 2011-08-04 NOTE — Progress Notes (Signed)
Addended by: Abigail Miyamoto D on: 08/04/2011 12:33 PM   Modules accepted: Orders

## 2011-08-04 NOTE — Progress Notes (Signed)
Addended by: Boone Master E on: 08/04/2011 12:56 PM   Modules accepted: Orders

## 2011-08-04 NOTE — Patient Instructions (Signed)
Finish Zpack and steroid taper.    Mucinex DM Twice daily  As needed  Cough/congesiton  I will call with xray results.  Please contact office for sooner follow up if symptoms do not improve or worsen or seek emergency care  follow up Dr. Sherene Sires  In 1 week as planned and As needed

## 2011-08-04 NOTE — Assessment & Plan Note (Addendum)
Flare - slow to resolve. Pt is improved but not resolved yet. (w/ 2 days of tx )  Will check xray today , cont on zpack for now , if PNA on xray consider broader coverage +/- admission if needed.  xopenex neb in office and Depo Medrol 80mg  IM in office today.   Plan;  Finish Zpack and steroid taper.    Mucinex DM Twice daily  As needed  Cough/congesiton  I will call with xray results.  Please contact office for sooner follow up if symptoms do not improve or worsen or seek emergency care  follow up Dr. Sherene Sires  In 1 week as planned and As needed

## 2011-08-06 ENCOUNTER — Ambulatory Visit (INDEPENDENT_AMBULATORY_CARE_PROVIDER_SITE_OTHER): Payer: BC Managed Care – PPO | Admitting: Internal Medicine

## 2011-08-06 ENCOUNTER — Encounter: Payer: Self-pay | Admitting: Internal Medicine

## 2011-08-06 DIAGNOSIS — J45909 Unspecified asthma, uncomplicated: Secondary | ICD-10-CM

## 2011-08-06 DIAGNOSIS — K219 Gastro-esophageal reflux disease without esophagitis: Secondary | ICD-10-CM

## 2011-08-06 MED ORDER — TRAMADOL HCL 50 MG PO TABS: ORAL_TABLET | ORAL | Status: AC

## 2011-08-06 NOTE — Progress Notes (Signed)
Subjective:     Patient ID: Suzanne Harvey, female   DOB: 1945/10/19, 65 y.o.   MRN: [id number]  HPI  67 yobf  with severe chronic asthma with an irreversible component on her best days has an FEV1 of approximately 1.2.   12/7/9 ov for yearly reeval with no cough or increased dyspnea over baseline.   September 03, 2009 ov cc  increased SOB and prod cough with yellow sputum x 5 days. was maintaining nicely on twice daily symbicort and qvar but exposed to sick grandchildren started with head cold > chest > increased saba to every 4-6 hours which is a marked change.   04/07/2011 f/u ov/Suzanne Harvey cc yearly f/u asthma good ex tol x limited by back on qvar, symbicort and rare xopenex. >>no changes   08/02/2011 Acute OV  Complains of  increased sob, productive cough with yellow phlegm, body aches, chest tightness, wheezing, x 3 days, Has had cold for last 1 week with nasal drip , cough and drainage. She was at ER with her granddaughter that was ill 3 days ago, around a lot of sick people with flu like illness. She complains since then she has increased cough, congestion and wheezing. No fever. Did have body aches initialy that have resolved. No hemopytsis or chest pain. No recent travel or abx use.  rec Finish Zpack and steroid taper.    Mucinex DM Twice daily  As needed  Cough/congesiton    08/06/2011 f/u ov/Suzanne Harvey cc breathing not back to normal,  better using xopenex every 4 hours, no purulent sputum, no aches or fever.  Sleeping ok without nocturnal  or early am exacerbation  of respiratory  c/o's or need for noct saba. Also denies any obvious fluctuation of symptoms with weather or environmental changes or other aggravating or alleviating factors except as outlined above            Past Medical History:  GASTROESOPHAGEAL REFLUX DISEASE (ICD-530.81)  ASTHMA (ICD-493.90)  - pft's 08/11/2007 FEV1 1.15 ratio 43% with 13% improvement after bronchodilators and nl DLC0  - nl alpha1AT 05/13/2006  - HFA  75% September 03, 2009               Objective:   Physical Exam  wt   173 September 03, 2009 >  162 04/07/2011 >160 >>160 08/02/2011 > 161 08/06/2011  Ambulatory healthy appearing in no acute distress.  Afeb with normal vital signs  HEENT: nl dentition, turbinates, and orophanx. Nl external ear canals without cough reflex  Neck without JVD/Nodes/TM  Lungs Coarse BS w/ exp wheezing noted   RRR no s3 or murmur or increase in P2  Abd soft and benign with nl excursion in the supine position. No bruits or organomegaly  Ext warm without calf tenderness, cyanosis clubbing or edema  Skin warm and dry without lesions      08/04/11 cxr Right perihilar and possible infrahilar nodular densities are not  well defined and may be infectious/inflammatory. Short-term  radiographic followup is recommended to exclude a mass. If the  patient has no current clinical evidence of pneumonia, further  evaluation with CT should be considered.  Assessment:         Plan:

## 2011-08-06 NOTE — Patient Instructions (Addendum)
Try prilosec 20mg   Take 30-60 min before first meal of the day and Pepcid 20 mg one bedtime until cough is completely gone for at least a week without the need for cough suppression  I think of reflux for chronic cough like I do oxygen for fire (doesn't cause the fire but once you get the oxygen suppressed it usually goes away regardless of the exact cause).   Take mucinex dm max of 1200 mg every 12 hours and supplement if needed with  tramadol 50 mg up to 2 every 4 hours to suppress the urge to cough. Swallowing water or using ice chips/non mint and menthol containing candies (such as lifesavers or sugarless jolly ranchers) are also effective.  You should rest your voice and avoid activities that you know make you cough.  Once you have eliminated the cough for 3 straight days try reducing the tramadol first,  then the mucinex dm as tolerated.    Finish all your antibiotics and prednisone   Please schedule a follow up office visit in 4 weeks, sooner if needed with med calendar in hand for follow up cxr

## 2011-08-07 ENCOUNTER — Encounter: Payer: Self-pay | Admitting: Internal Medicine

## 2011-08-07 NOTE — Assessment & Plan Note (Signed)
Flare in setting of viral illness    Each maintenance medication was reviewed in detail including most importantly the difference between maintenance and as needed and under what circumstances the prns are to be used.  She did not bring in her calerndar as requested for updating. Reminded to do so on next ov.  Please see instructions for details which were reviewed in writing and the patient given a copy.

## 2011-08-10 ENCOUNTER — Ambulatory Visit: Payer: BC Managed Care – PPO | Admitting: Internal Medicine

## 2011-09-03 ENCOUNTER — Ambulatory Visit: Payer: BC Managed Care – PPO | Admitting: Internal Medicine

## 2011-09-06 ENCOUNTER — Ambulatory Visit: Payer: BC Managed Care – PPO | Admitting: Internal Medicine

## 2011-09-30 ENCOUNTER — Encounter: Payer: Self-pay | Admitting: Internal Medicine

## 2011-09-30 ENCOUNTER — Ambulatory Visit (INDEPENDENT_AMBULATORY_CARE_PROVIDER_SITE_OTHER): Payer: BC Managed Care – PPO | Admitting: Internal Medicine

## 2011-09-30 ENCOUNTER — Ambulatory Visit (INDEPENDENT_AMBULATORY_CARE_PROVIDER_SITE_OTHER)
Admission: RE | Admit: 2011-09-30 | Discharge: 2011-09-30 | Disposition: A | Discharge status: Home / Self Care | Payer: BC Managed Care – PPO | Source: Ambulatory Visit | Attending: Internal Medicine | Admitting: Internal Medicine

## 2011-09-30 DIAGNOSIS — J45909 Unspecified asthma, uncomplicated: Secondary | ICD-10-CM

## 2011-09-30 DIAGNOSIS — K219 Gastro-esophageal reflux disease without esophagitis: Secondary | ICD-10-CM

## 2011-09-30 LAB — PULMONARY FUNCTION TEST

## 2011-09-30 MED ORDER — BECLOMETHASONE DIPROPIONATE 80 MCG/ACT IN AERS: 2.0000 | INHALATION_SPRAY | Freq: Every day | RESPIRATORY_TRACT | Status: DC

## 2011-09-30 MED ORDER — BUDESONIDE-FORMOTEROL FUMARATE 160-4.5 MCG/ACT IN AERO: INHALATION_SPRAY | RESPIRATORY_TRACT | Status: DC

## 2011-09-30 MED ORDER — MONTELUKAST SODIUM 10 MG PO TABS: 10.0000 mg | ORAL_TABLET | Freq: Every day | ORAL | Status: DC

## 2011-09-30 NOTE — Patient Instructions (Addendum)
Symbicort 160 Take 2 puffs first thing in am and then another 2 puffs about 12 hours later.  Qvar 80 2 puffs after am dose of symbicort (only in am, only need the 15 min delay if short of breath or tight in am, otherwise use it right after symbicort)  Please schedule a follow up visit in 3 months but call sooner if needed

## 2011-09-30 NOTE — Progress Notes (Signed)
PFT done today. 

## 2011-09-30 NOTE — Assessment & Plan Note (Signed)
-   PFT's 09/30/2011  FEV1  1.34 (70%) ratio 44 and no change p B2, DLCO 62 corrects to 95%  All goals of chronic asthma control met including optimal (though not nl)  function and elimination of symptoms with minimal need for rescue therapy.  Contingencies discussed in full including contacting this office immediately if not controlling the symptoms using the rule of two's.     Surprisingly she actually wasn't using symbicort or qvar the way they were intended as reflected in the med calendar we provided.   See instructions for specific recommendations which were reviewed directly with the patient who was given a copy with highlighter outlining the key components.

## 2011-09-30 NOTE — Progress Notes (Signed)
Subjective:     Patient ID: Suzanne Harvey, female   DOB: [birth year]   MRN: [id number]  HPI  25 yobf  with  Mod severe chronic asthma with an irreversible component improved to FEV1 70% 09/30/2011   04/07/2011 f/u ov/Wert cc yearly f/u asthma good ex tol x limited by back on qvar, symbicort and rare xopenex. >>no changes    08/06/2011 f/u ov/Wert cc breathing not back to normal,  better using xopenex every 4 hours, no purulent sputum, no aches or fever. rec Use ppi/h2 hs until cough gone > resolved Return with calendar   09/30/2011 f/u ov/Wert  Did not bring calendar, not using meds correctly but cc better breathing no cough on symbicort 160 2 qam/ qvar 80 2 q am only.  Sleeping ok without nocturnal  or early am exacerbation  of respiratory  c/o's or need for noct saba. Also denies any obvious fluctuation of symptoms with weather or environmental changes or other aggravating or alleviating factors except as outlined above .  ROS  At present neg for  any significant sore throat, dysphagia, itching, sneezing,  nasal congestion or excess/ purulent secretions,  fever, chills, sweats, unintended wt loss, pleuritic or exertional cp, hempoptysis, orthopnea pnd or leg swelling.  Also denies presyncope, palpitations, heartburn, abdominal pain, nausea, vomiting, diarrhea  or change in bowel or urinary habits, dysuria,hematuria,  rash, arthralgias, visual complaints, headache, numbness weakness or ataxia.             Past Medical History:  GASTROESOPHAGEAL REFLUX DISEASE (ICD-530.81)  ASTHMA (ICD-493.90)  - pft's 08/11/2007 FEV1 1.15 ratio 43% with 13% improvement after bronchodilators and nl DLC0  - nl alpha1AT 05/13/2006  - HFA 75% September 03, 2009               Objective:   Physical Exam  wt   173 September 03, 2009 >  162 04/07/2011 > 160 08/02/2011 > 161 08/06/2011 > 160 09/30/2011  Ambulatory healthy appearing in no acute distress.  Afeb with normal vital signs  HEENT: nl dentition,  turbinates, and orophanx. Nl external ear canals without cough reflex  Neck without JVD/Nodes/TM  Lungs Coarse BS w/ exp wheezing noted   RRR no s3 or murmur or increase in P2  Abd soft and benign with nl excursion in the supine position. No bruits or organomegaly  Ext warm without calf tenderness, cyanosis clubbing or edema  Skin warm and dry without lesions     CXR  09/30/2011 :  Interval resolution of right perihilar opacities.  Stable cardiomegaly.    Assessment:         Plan:

## 2011-10-06 ENCOUNTER — Encounter: Payer: Self-pay | Admitting: Internal Medicine

## 2011-12-02 ENCOUNTER — Other Ambulatory Visit: Payer: Self-pay | Admitting: Internal Medicine

## 2012-06-02 ENCOUNTER — Telehealth: Payer: Self-pay | Admitting: Internal Medicine

## 2012-06-02 NOTE — Telephone Encounter (Signed)
Called for PA at [phone]. Member #[id number].Awaiting faxed form.

## 2012-06-02 NOTE — Telephone Encounter (Signed)
I will be out of the office next wee, so I am forwarding this msg to triage so someone may follow-up on this form next week.

## 2012-06-06 NOTE — Telephone Encounter (Signed)
I don't see anything in triage. Verlon Au have you received this fax yet or Dr. Sherene Sires? thanks

## 2012-06-07 NOTE — Telephone Encounter (Signed)
Called PA dept and spoke with Elvis and advised form never received He states will re-fax to triage Will await fax

## 2012-06-13 NOTE — Telephone Encounter (Signed)
Per Judeth Cornfield at [facility], Montelukast APPROVED indefinitely and the pharmacy has been notified.

## 2012-07-11 ENCOUNTER — Other Ambulatory Visit: Payer: Self-pay | Admitting: Internal Medicine

## 2012-09-02 ENCOUNTER — Other Ambulatory Visit: Payer: Self-pay | Admitting: Internal Medicine

## 2012-12-26 ENCOUNTER — Other Ambulatory Visit: Payer: Self-pay | Admitting: Internal Medicine

## 2013-03-08 ENCOUNTER — Other Ambulatory Visit: Payer: Self-pay | Admitting: Internal Medicine

## 2013-03-16 ENCOUNTER — Ambulatory Visit (INDEPENDENT_AMBULATORY_CARE_PROVIDER_SITE_OTHER): Payer: Medicare Other | Admitting: Internal Medicine

## 2013-03-16 ENCOUNTER — Encounter: Payer: Self-pay | Admitting: Internal Medicine

## 2013-03-16 VITALS — BP 130/74 | HR 66 | Temp 98.0°F | Ht 62.0 in | Wt 164.0 lb

## 2013-03-16 DIAGNOSIS — J45909 Unspecified asthma, uncomplicated: Secondary | ICD-10-CM

## 2013-03-16 DIAGNOSIS — J4540 Moderate persistent asthma, uncomplicated: Secondary | ICD-10-CM

## 2013-03-16 MED ORDER — BUDESONIDE-FORMOTEROL FUMARATE 160-4.5 MCG/ACT IN AERO: INHALATION_SPRAY | RESPIRATORY_TRACT | Status: DC

## 2013-03-16 MED ORDER — BECLOMETHASONE DIPROPIONATE 80 MCG/ACT IN AERS: INHALATION_SPRAY | RESPIRATORY_TRACT | Status: DC

## 2013-03-16 MED ORDER — LEVALBUTEROL TARTRATE 45 MCG/ACT IN AERO: INHALATION_SPRAY | RESPIRATORY_TRACT | Status: DC

## 2013-03-16 NOTE — Progress Notes (Signed)
Subjective:     Patient ID: Suzanne Harvey, female   DOB: [birth year]   MRN: [id number]    Brief patient profile:  8 yobf  with  Mod severe chronic asthma with an irreversible component improved to FEV1 70% 09/30/2011     03/16/2013 f/u ov/Leotha Westermeyer re asthma, confused with instructions/ no longer using med calendar provided Chief Complaint  Patient presents with  . Follow-up    last seen 09/2011. Pt reports she has good and bad days w/ breathing. Denies any cough, no wheezing, no chest tx  variable sob but not needing any saba, using qvar bid and symbicort 160 once a day.   No obvious [pattern to daytime variabilty or assoc chronic cough or cp or chest tightness, subjective wheeze overt sinus or hb symptoms. No unusual exp hx or h/o childhood pna/ asthma or knowledge of premature birth.   Sleeping ok without nocturnal  or early am exacerbation  of respiratory  c/o's or need for noct saba. Also denies any obvious fluctuation of symptoms with weather or environmental changes or other aggravating or alleviating factors except as outlined above .  Current Medications, Allergies, Complete Past Medical History, Past Surgical History, Family History, and Social History were reviewed in [employer] record.  ROS  The following are not active complaints unless bolded sore throat, dysphagia, dental problems, itching, sneezing,  nasal congestion or excess/ purulent secretions, ear ache,   fever, chills, sweats, unintended wt loss, pleuritic or exertional cp, hemoptysis,  orthopnea pnd or leg swelling, presyncope, palpitations, heartburn, abdominal pain, anorexia, nausea, vomiting, diarrhea  or change in bowel or urinary habits, change in stools or urine, dysuria,hematuria,  rash, arthralgias, visual complaints, headache, numbness weakness or ataxia or problems with walking or coordination,  change in mood/affect or memory.    .             Past Medical History:  GASTROESOPHAGEAL  REFLUX DISEASE (ICD-530.81)  ASTHMA (ICD-493.90)  - pft's 08/11/2007 FEV1 1.15 ratio 43% with 13% improvement after bronchodilators and nl DLC0  - nl alpha1AT 05/13/2006  - HFA 75% September 03, 2009               Objective:   Physical Exam  wt   173 September 03, 2009 >  162 04/07/2011 > 160 08/02/2011 > 161 08/06/2011 > 160 09/30/2011 > 03/16/2013  164  Ambulatory healthy appearing in no acute distress.  Afeb with normal vital signs  HEENT: nl dentition, turbinates, and orophanx. Nl external ear canals without cough reflex  Neck without JVD/Nodes/TM  Lungs slt distant bs but no wheeze   RRR no s3 or murmur or increase in P2  Abd soft and benign with nl excursion in the supine position. No bruits or organomegaly  Ext warm without calf tenderness, cyanosis clubbing or edema  Skin warm and dry without lesions     CXR  09/30/2011 :  Interval resolution of right perihilar opacities.  Stable cardiomegaly.    Assessment:

## 2013-03-16 NOTE — Patient Instructions (Addendum)
Symbicort 160 Take 2 puffs first thing in am and then another 2 puffs about 12 hours later.  Qvar 80 2 puffs immediately after am dose of symbicort (only in am, only need the 15 min delay if short of breath or tight in am)  Only use your albuterol (xopenex) as a rescue medication to be used if you can't catch your breath by resting or doing a relaxed purse lip breathing pattern. The less you use it, the better it will work when you need it. .     Please schedule a follow up visit in 12 months but call sooner if needed with pfts on return

## 2013-03-17 NOTE — Assessment & Plan Note (Addendum)
-   PFT's 09/30/2011  FEV1  1.34 (70%) ratio 44 and no change p B2, DLCO 62 corrects to 95%  The proper method of use, as well as anticipated side effects, of a metered-dose inhaler are discussed and demonstrated to the patient. Improved effectiveness after extensive coaching during this visit to a level of approximately  90%   I had an extended discussion with the patient today lasting 15 to 20 minutes of a 25 minute visit on the following issues:    Each maintenance medication was reviewed in detail including most importantly the difference between maintenance and as needed and under what circumstances the prns are to be used.  Please see instructions for details which were reviewed in writing and the patient given a copy.    Given the persistent nature of her airflow obst and variable doe, she should be taking the max dose of symbicort 160 2bid and supplementing with qvar 80 2 pffs qam to reach the smallest airways.  She continues to confuse these instructions for reasons that are not clear to me so I spent extra time with her:  See instructions for specific recommendations which were reviewed directly with the patient who was given a copy with highlighter outlining the key components.

## 2013-04-03 ENCOUNTER — Encounter: Payer: Self-pay | Admitting: Internal Medicine

## 2013-04-05 ENCOUNTER — Other Ambulatory Visit: Payer: Self-pay | Admitting: Internal Medicine

## 2013-05-25 ENCOUNTER — Other Ambulatory Visit: Payer: Self-pay | Admitting: Family Medicine

## 2013-05-25 DIAGNOSIS — Z1231 Encounter for screening mammogram for malignant neoplasm of breast: Secondary | ICD-10-CM

## 2013-06-01 ENCOUNTER — Ambulatory Visit
Admission: RE | Admit: 2013-06-01 | Discharge: 2013-06-01 | Disposition: A | Discharge status: Home / Self Care | Payer: Medicare Other | Source: Ambulatory Visit | Attending: Family Medicine | Admitting: Family Medicine

## 2013-06-01 DIAGNOSIS — Z1231 Encounter for screening mammogram for malignant neoplasm of breast: Secondary | ICD-10-CM

## 2014-03-11 ENCOUNTER — Other Ambulatory Visit: Payer: Self-pay | Admitting: Internal Medicine

## 2014-03-19 ENCOUNTER — Other Ambulatory Visit: Payer: Self-pay | Admitting: Internal Medicine

## 2014-03-19 DIAGNOSIS — J45909 Unspecified asthma, uncomplicated: Secondary | ICD-10-CM

## 2014-03-20 ENCOUNTER — Telehealth: Payer: Self-pay | Admitting: Internal Medicine

## 2014-03-20 ENCOUNTER — Ambulatory Visit: Payer: Medicare Other | Admitting: Internal Medicine

## 2014-03-20 MED ORDER — ALBUTEROL SULFATE HFA 108 (90 BASE) MCG/ACT IN AERS: 2.0000 | INHALATION_SPRAY | Freq: Four times a day (QID) | RESPIRATORY_TRACT | Status: DC | PRN

## 2014-03-20 NOTE — Telephone Encounter (Signed)
We did not have any inhalers in office for rescue. Pt reports her insurance no longer covers xopenex.  Pt has pending appt 04/03/14 with MW and PFT. Please advise thanks

## 2014-03-20 NOTE — Telephone Encounter (Signed)
Pt aware RX sent in. Nothing further needed 

## 2014-03-20 NOTE — Telephone Encounter (Signed)
proair should be on formulary to substitute

## 2014-04-03 ENCOUNTER — Ambulatory Visit (INDEPENDENT_AMBULATORY_CARE_PROVIDER_SITE_OTHER): Payer: Commercial Managed Care - HMO | Admitting: Internal Medicine

## 2014-04-03 ENCOUNTER — Encounter: Payer: Self-pay | Admitting: Internal Medicine

## 2014-04-03 VITALS — BP 126/70 | HR 57 | Temp 98.2°F | Ht 60.0 in | Wt 159.0 lb

## 2014-04-03 DIAGNOSIS — J4540 Moderate persistent asthma, uncomplicated: Secondary | ICD-10-CM

## 2014-04-03 DIAGNOSIS — R0609 Other forms of dyspnea: Secondary | ICD-10-CM

## 2014-04-03 DIAGNOSIS — J45909 Unspecified asthma, uncomplicated: Secondary | ICD-10-CM

## 2014-04-03 DIAGNOSIS — R0989 Other specified symptoms and signs involving the circulatory and respiratory systems: Secondary | ICD-10-CM

## 2014-04-03 LAB — PULMONARY FUNCTION TEST
DL/VA % pred: 99 %
DL/VA: 4.23 ml/min/mmHg/L
DLCO unc % pred: 90 %
DLCO unc: 17.12 ml/min/mmHg
FEF 25-75 Post: 0.4 L/sec
FEF 25-75 Pre: 0.4 L/sec
FEF2575-%Change-Post: 0 %
FEF2575-%Pred-Post: 27 %
FEF2575-%Pred-Pre: 28 %
FEV1-%Change-Post: -1 %
FEV1-%Pred-Post: 60 %
FEV1-%Pred-Pre: 61 %
FEV1-Post: 0.91 L
FEV1-Pre: 0.92 L
FEV1FVC-%Change-Post: -3 %
FEV1FVC-%Pred-Pre: 56 %
FEV6-%Change-Post: 1 %
FEV6-%Pred-Post: 110 %
FEV6-%Pred-Pre: 109 %
FEV6-Post: 2.05 L
FEV6-Pre: 2.03 L
FEV6FVC-%Change-Post: 0 %
FEV6FVC-%Pred-Post: 99 %
FEV6FVC-%Pred-Pre: 100 %
FVC-%Change-Post: 1 %
FVC-%Pred-Post: 110 %
FVC-%Pred-Pre: 109 %
FVC-Post: 2.16 L
Post FEV1/FVC ratio: 42 %
Post FEV6/FVC ratio: 95 %
Pre FEV1/FVC ratio: 43 %
Pre FEV6/FVC Ratio: 95 %
RV % pred: 108 %
RV: 2.14 L
TLC % pred: 103 %
TLC: 4.59 L

## 2014-04-03 MED ORDER — ALBUTEROL SULFATE HFA 108 (90 BASE) MCG/ACT IN AERS: INHALATION_SPRAY | RESPIRATORY_TRACT | Status: DC

## 2014-04-03 NOTE — Patient Instructions (Addendum)
Next refill, use proair as needed as it has counter otherwise   Please schedule a follow up visit in 12  months but call sooner if needed

## 2014-04-03 NOTE — Progress Notes (Addendum)
Subjective:     Patient ID: Suzanne Harvey, female   DOB: 09/17/1945   MRN: [id number]    Brief patient profile:  1 yobf never smoker  with  severe chronic asthma (since childhood)  with an irreversible component improved to FEV1 70% 09/30/2011    History of Present Illness  03/16/2013 f/u ov/[name] re asthma, confused with instructions/ no longer using med calendar provided Chief Complaint  Patient presents with  . Follow-up    last seen 09/2011. Pt reports she has good and bad days w/ breathing. Denies any cough, no wheezing, no chest tx  variable sob but not needing any saba, using qvar bid and symbicort 160 once a day.  rec Symbicort 160 Take 2 puffs first thing in am and then another 2 puffs about 12 hours later.  Qvar 80 2 puffs immediately after am dose of symbicort (only in am, only need the 15 min delay if short of breath or tight in am) Only use your albuterol (xopenex) as a rescue medication      04/03/2014 f/u ov/[name] re: severe chronic asthma Chief Complaint  Patient presents with  . Followup with PFT    Pt states doing well and denies any new co's today. She is using her rescue inhaler once per month on average.    Not limited by breathing from desired activities  - worse when hot/ humid  No obvious other patterns to daytime variabilty or assoc chronic cough or cp or chest tightness, subjective wheeze overt sinus or hb symptoms. No unusual exp hx or h/o childhood pna/   or knowledge of premature birth.   Sleeping ok without nocturnal  or early am exacerbation  of respiratory  c/o's or need for noct saba. Also denies any obvious fluctuation of symptoms with weather or environmental changes or other aggravating or alleviating factors except as outlined above .  Current Medications, Allergies, Complete Past Medical History, Past Surgical History, Family History, and Social History were reviewed in [employer] record.  ROS  The following are not active  complaints unless bolded sore throat, dysphagia, dental problems, itching, sneezing,  nasal congestion or excess/ purulent secretions, ear ache,   fever, chills, sweats, unintended wt loss, pleuritic or exertional cp, hemoptysis,  orthopnea pnd or leg swelling, presyncope, palpitations, heartburn, abdominal pain, anorexia, nausea, vomiting, diarrhea  or change in bowel or urinary habits, change in stools or urine, dysuria,hematuria,  rash, arthralgias, visual complaints, headache, numbness weakness or ataxia or problems with walking or coordination,  change in mood/affect or memory.    .             Past Medical History:  GASTROESOPHAGEAL REFLUX DISEASE (ICD-530.81)  ASTHMA (ICD-493.90)  - pft's 08/11/2007 FEV1 1.15 ratio 43% with 13% improvement after bronchodilators and nl DLC0  - nl alpha1AT 05/13/2006                 Objective:   Physical Exam  wt   173 September 03, 2009 >  162 04/07/2011 > 160 08/02/2011 > 161 08/06/2011 > 160 09/30/2011 > 03/16/2013  164 > 04/03/2014  159   Ambulatory healthy appearing in no acute distress.  Afeb with normal vital signs  HEENT: nl dentition, turbinates, and orophanx. Nl external ear canals without cough reflex  Neck without JVD/Nodes/TM  Lungs slt distant bs but no wheeze   RRR no s3 or murmur or increase in P2  Abd soft and benign with nl excursion in the supine  position. No bruits or organomegaly  Ext warm without calf tenderness, cyanosis clubbing or edema  Skin warm and dry without lesions     CXR  09/30/2011 :  Interval resolution of right perihilar opacities.  Stable cardiomegaly.    Assessment:

## 2014-04-03 NOTE — Progress Notes (Signed)
PFT done today. 

## 2014-04-03 NOTE — Assessment & Plan Note (Addendum)
-  PFT's 09/30/2011  FEV1  1.34 (70%) ratio 44 and no change p B2, DLCO 62 corrects to 95%    - PFT's 04/03/2014  FEV1 0.91 (60%) ratio 42 and no change p B2 and DLCO 90%   All goals of chronic asthma control met including optimal (though certainly not nl)  function and elimination of symptoms with minimal need for rescue therapy.  Contingencies discussed in full including contacting this office immediately if not controlling the symptoms using the rule of two's.     The proper method of use, as well as anticipated side effects, of a metered-dose inhaler are discussed and demonstrated to the patient. Improved effectiveness after extensive coaching during this visit to a level of approximately  90%     Each maintenance medication was reviewed in detail including most importantly the difference between maintenance and as needed and under what circumstances the prns are to be used.  Please see instructions for details which were reviewed in writing and the patient given a copy.

## 2014-05-03 ENCOUNTER — Other Ambulatory Visit: Payer: Self-pay | Admitting: Internal Medicine

## 2014-07-26 ENCOUNTER — Other Ambulatory Visit: Payer: Self-pay

## 2014-07-26 DIAGNOSIS — Z1231 Encounter for screening mammogram for malignant neoplasm of breast: Secondary | ICD-10-CM

## 2014-08-12 ENCOUNTER — Ambulatory Visit
Admission: RE | Admit: 2014-08-12 | Discharge: 2014-08-12 | Disposition: A | Discharge status: Home / Self Care | Payer: Commercial Managed Care - HMO | Source: Ambulatory Visit

## 2014-08-12 DIAGNOSIS — Z1231 Encounter for screening mammogram for malignant neoplasm of breast: Secondary | ICD-10-CM

## 2014-10-30 ENCOUNTER — Other Ambulatory Visit: Payer: Self-pay | Admitting: Internal Medicine

## 2015-01-23 DIAGNOSIS — E119 Type 2 diabetes mellitus without complications: Secondary | ICD-10-CM | POA: Diagnosis not present

## 2015-01-28 DIAGNOSIS — E780 Pure hypercholesterolemia: Secondary | ICD-10-CM | POA: Diagnosis not present

## 2015-01-28 DIAGNOSIS — R7309 Other abnormal glucose: Secondary | ICD-10-CM | POA: Diagnosis not present

## 2015-01-28 DIAGNOSIS — E119 Type 2 diabetes mellitus without complications: Secondary | ICD-10-CM | POA: Diagnosis not present

## 2015-04-14 DIAGNOSIS — H40013 Open angle with borderline findings, low risk, bilateral: Secondary | ICD-10-CM | POA: Diagnosis not present

## 2015-04-18 ENCOUNTER — Encounter: Payer: Self-pay | Admitting: Internal Medicine

## 2015-04-18 ENCOUNTER — Ambulatory Visit (INDEPENDENT_AMBULATORY_CARE_PROVIDER_SITE_OTHER): Payer: Commercial Managed Care - HMO | Admitting: Internal Medicine

## 2015-04-18 VITALS — BP 126/70 | HR 66 | Ht 63.0 in | Wt 160.0 lb

## 2015-04-18 DIAGNOSIS — J4550 Severe persistent asthma, uncomplicated: Secondary | ICD-10-CM

## 2015-04-18 NOTE — Patient Instructions (Addendum)
Prevnar 13 one time only is all that's recommended  Work on maintaining perfect  inhaler technique:  relax and gently blow all the way out then take a nice slow smooth deep breath back in, triggering the inhaler at same time you start breathing in.  Hold for up to 5 seconds if you can. Blow out thru nose. Rinse and gargle with water when done  Please schedule a follow up visit in 12 months but call sooner if needed

## 2015-04-18 NOTE — Progress Notes (Signed)
Subjective:     Patient ID: Suzanne Harvey, female   DOB: 13-Jul-1946   MRN: [id number]    Brief patient profile:  44 yobf never smoker  with  severe chronic asthma (since childhood)  with an irreversible component improved to FEV1 70% 09/30/2011    History of Present Illness  03/16/2013 f/u ov/[name] re asthma, confused with instructions/ no longer using med calendar provided Chief Complaint  Patient presents with  . Follow-up    last seen 09/2011. Pt reports she has good and bad days w/ breathing. Denies any cough, no wheezing, no chest tx  variable sob but not needing any saba, using qvar bid and symbicort 160 once a day.  rec Symbicort 160 Take 2 puffs first thing in am and then another 2 puffs about 12 hours later.  Qvar 80 2 puffs immediately after am dose of symbicort (only in am, only need the 15 min delay if short of breath or tight in am) Only use your albuterol (xopenex) as a rescue medication      04/03/2014 f/u ov/[name] re: severe chronic asthma Chief Complaint  Patient presents with  . Followup with PFT    Pt states doing well and denies any new co's today. She is using her rescue inhaler once per month on average.    Not limited by breathing from desired activities  - worse when hot/ humid rec Next refill, use proair as needed as it has a counter   04/18/2015 f/u ov/[name] re: severe chronic asthma  On symbicort 160 2bid and qvar 2 qam and singulair daily  Chief Complaint  Patient presents with  . Follow-up    Pt states that her breathing is overall doing well as long as she is not out in the heat too long. She uses albuterol 2-3 x per wk.    sleeps fine/ exercise = goes to y doing treadmill and bike ok tol   No obvious  Day to day or  daytime variabilty or assoc chronic cough or cp or chest tightness, subjective wheeze overt sinus or hb symptoms. No unusual exp hx or h/o childhood pna/   or knowledge of premature birth.   Sleeping ok without nocturnal  or early am  exacerbation  of respiratory  c/o's or need for noct saba. Also denies any obvious fluctuation of symptoms with weather or environmental changes or other aggravating or alleviating factors except as outlined above .  Current Medications, Allergies, Complete Past Medical History, Past Surgical History, Family History, and Social History were reviewed in [employer] record.  ROS  The following are not active complaints unless bolded sore throat, dysphagia, dental problems, itching, sneezing,  nasal congestion or excess/ purulent secretions, ear ache,   fever, chills, sweats, unintended wt loss, pleuritic or exertional cp, hemoptysis,  orthopnea pnd or leg swelling, presyncope, palpitations, heartburn, abdominal pain, anorexia, nausea, vomiting, diarrhea  or change in bowel or urinary habits, change in stools or urine, dysuria,hematuria,  rash, arthralgias, visual complaints, headache, numbness weakness or ataxia or problems with walking or coordination,  change in mood/affect or memory.    .             Past Medical History:  GASTROESOPHAGEAL REFLUX DISEASE (ICD-530.81)  ASTHMA (ICD-493.90)  - pft's 08/11/2007 FEV1 1.15 ratio 43% with 13% improvement after bronchodilators and nl DLC0  - nl alpha1AT 05/13/2006       Objective:   Physical Exam  wt   173 September 03, 2009 >  162 04/07/2011 > 160 08/02/2011 > 161 08/06/2011 > 160 09/30/2011 > 03/16/2013  164 > 04/03/2014  159 > 04/18/2015  160   Vital signs reviewed   Ambulatory healthy appearing in no acute distress.  HEENT: nl dentition, turbinates, and orophanx. Nl external ear canals without cough reflex  Neck without JVD/Nodes/TM  Lungs slt distant bs but no wheeze   RRR no s3 or murmur or increase in P2  Abd soft and benign with nl excursion in the supine position. No bruits or organomegaly  Ext warm without calf tenderness, cyanosis clubbing or edema  Skin warm and dry without lesions         Assessment:

## 2015-04-19 ENCOUNTER — Encounter: Payer: Self-pay | Admitting: Internal Medicine

## 2015-04-19 NOTE — Assessment & Plan Note (Signed)
-  PFT's 09/30/2011  FEV1  1.34 (70%) ratio 44 and no change p B2, DLCO 62 corrects to 95%    - PFT's 04/03/2014  FEV1 0.91 (60%) ratio 42 and no change p B2 and DLCO 90%   The proper method of use, as well as anticipated side effects, of a metered-dose inhaler are discussed and demonstrated to the patient. Improved effectiveness after extensive coaching during this visit to a level of approximately  75% limited by short Ti  Spite the severity of her airflow obstruction she actually has a very good quality of life with really no limitations including [facility] and no tendency to night time or other exacerbations or increased dependency for albuterol.  Therefore All goals of chronic asthma control met including optimal (though certainly not nl)  function and elimination of symptoms with minimal need for rescue therapy on present rx includes Symbicort for the larger airways and Qvar for the smaller ones) and note no need for prednisone over the last year while on this regimen  Contingencies discussed in full including contacting this office immediately if not controlling the symptoms using the rule of two's.     I had an extended discussion with the patient reviewing all relevant studies completed to date and  lasting 15 to 20 minutes of a 25 minute visit    Each maintenance medication was reviewed in detail including most importantly the difference between maintenance and prns and under what circumstances the prns are to be triggered using an action plan format that is not reflected in the computer generated alphabetically organized AVS.    Please see instructions for details which were reviewed in writing and the patient given a copy highlighting the part that I personally wrote and discussed at today's ov.

## 2015-06-05 ENCOUNTER — Other Ambulatory Visit: Payer: Self-pay | Admitting: Emergency Medicine

## 2015-06-05 MED ORDER — MONTELUKAST SODIUM 10 MG PO TABS: 10.0000 mg | ORAL_TABLET | Freq: Every day | ORAL | Status: DC

## 2015-08-28 DIAGNOSIS — R262 Difficulty in walking, not elsewhere classified: Secondary | ICD-10-CM | POA: Diagnosis not present

## 2015-08-28 DIAGNOSIS — M545 Low back pain: Secondary | ICD-10-CM | POA: Diagnosis not present

## 2015-08-28 DIAGNOSIS — M6281 Muscle weakness (generalized): Secondary | ICD-10-CM | POA: Diagnosis not present

## 2015-09-04 DIAGNOSIS — M6281 Muscle weakness (generalized): Secondary | ICD-10-CM | POA: Diagnosis not present

## 2015-09-04 DIAGNOSIS — M545 Low back pain: Secondary | ICD-10-CM | POA: Diagnosis not present

## 2015-09-04 DIAGNOSIS — R262 Difficulty in walking, not elsewhere classified: Secondary | ICD-10-CM | POA: Diagnosis not present

## 2015-09-08 ENCOUNTER — Other Ambulatory Visit: Payer: Self-pay | Admitting: Internal Medicine

## 2015-09-09 DIAGNOSIS — M545 Low back pain: Secondary | ICD-10-CM | POA: Diagnosis not present

## 2015-09-09 DIAGNOSIS — R262 Difficulty in walking, not elsewhere classified: Secondary | ICD-10-CM | POA: Diagnosis not present

## 2015-09-09 DIAGNOSIS — M6281 Muscle weakness (generalized): Secondary | ICD-10-CM | POA: Diagnosis not present

## 2015-09-10 DIAGNOSIS — R262 Difficulty in walking, not elsewhere classified: Secondary | ICD-10-CM | POA: Diagnosis not present

## 2015-09-10 DIAGNOSIS — M545 Low back pain: Secondary | ICD-10-CM | POA: Diagnosis not present

## 2015-09-10 DIAGNOSIS — M6281 Muscle weakness (generalized): Secondary | ICD-10-CM | POA: Diagnosis not present

## 2015-09-16 DIAGNOSIS — R262 Difficulty in walking, not elsewhere classified: Secondary | ICD-10-CM | POA: Diagnosis not present

## 2015-09-16 DIAGNOSIS — M6281 Muscle weakness (generalized): Secondary | ICD-10-CM | POA: Diagnosis not present

## 2015-09-16 DIAGNOSIS — M545 Low back pain: Secondary | ICD-10-CM | POA: Diagnosis not present

## 2015-09-18 DIAGNOSIS — M6281 Muscle weakness (generalized): Secondary | ICD-10-CM | POA: Diagnosis not present

## 2015-09-18 DIAGNOSIS — R262 Difficulty in walking, not elsewhere classified: Secondary | ICD-10-CM | POA: Diagnosis not present

## 2015-09-18 DIAGNOSIS — M545 Low back pain: Secondary | ICD-10-CM | POA: Diagnosis not present

## 2015-09-23 DIAGNOSIS — R262 Difficulty in walking, not elsewhere classified: Secondary | ICD-10-CM | POA: Diagnosis not present

## 2015-09-23 DIAGNOSIS — M545 Low back pain: Secondary | ICD-10-CM | POA: Diagnosis not present

## 2015-09-23 DIAGNOSIS — M6281 Muscle weakness (generalized): Secondary | ICD-10-CM | POA: Diagnosis not present

## 2015-09-25 DIAGNOSIS — M545 Low back pain: Secondary | ICD-10-CM | POA: Diagnosis not present

## 2015-09-25 DIAGNOSIS — R262 Difficulty in walking, not elsewhere classified: Secondary | ICD-10-CM | POA: Diagnosis not present

## 2015-09-25 DIAGNOSIS — M6281 Muscle weakness (generalized): Secondary | ICD-10-CM | POA: Diagnosis not present

## 2015-10-02 DIAGNOSIS — M545 Low back pain: Secondary | ICD-10-CM | POA: Diagnosis not present

## 2015-10-02 DIAGNOSIS — R262 Difficulty in walking, not elsewhere classified: Secondary | ICD-10-CM | POA: Diagnosis not present

## 2015-10-02 DIAGNOSIS — M6281 Muscle weakness (generalized): Secondary | ICD-10-CM | POA: Diagnosis not present

## 2015-10-08 DIAGNOSIS — M6281 Muscle weakness (generalized): Secondary | ICD-10-CM | POA: Diagnosis not present

## 2015-10-08 DIAGNOSIS — M545 Low back pain: Secondary | ICD-10-CM | POA: Diagnosis not present

## 2015-10-08 DIAGNOSIS — R262 Difficulty in walking, not elsewhere classified: Secondary | ICD-10-CM | POA: Diagnosis not present

## 2015-11-15 DIAGNOSIS — Z6830 Body mass index (BMI) 30.0-30.9, adult: Secondary | ICD-10-CM | POA: Diagnosis not present

## 2015-11-15 DIAGNOSIS — I10 Essential (primary) hypertension: Secondary | ICD-10-CM | POA: Diagnosis not present

## 2015-11-15 DIAGNOSIS — E119 Type 2 diabetes mellitus without complications: Secondary | ICD-10-CM | POA: Diagnosis not present

## 2015-11-15 DIAGNOSIS — J4520 Mild intermittent asthma, uncomplicated: Secondary | ICD-10-CM | POA: Diagnosis not present

## 2015-11-15 DIAGNOSIS — E6609 Other obesity due to excess calories: Secondary | ICD-10-CM | POA: Diagnosis not present

## 2015-11-16 ENCOUNTER — Other Ambulatory Visit: Payer: Self-pay | Admitting: Internal Medicine

## 2016-02-10 ENCOUNTER — Other Ambulatory Visit: Payer: Self-pay | Admitting: Internal Medicine

## 2016-02-11 NOTE — Telephone Encounter (Signed)
Pt calling to get her inhalers symbicort and queval and proair refilled. [facility] on [address].

## 2016-03-18 DIAGNOSIS — E119 Type 2 diabetes mellitus without complications: Secondary | ICD-10-CM | POA: Diagnosis not present

## 2016-03-18 DIAGNOSIS — J4520 Mild intermittent asthma, uncomplicated: Secondary | ICD-10-CM | POA: Diagnosis not present

## 2016-03-18 DIAGNOSIS — I10 Essential (primary) hypertension: Secondary | ICD-10-CM | POA: Diagnosis not present

## 2016-03-20 DIAGNOSIS — I10 Essential (primary) hypertension: Secondary | ICD-10-CM | POA: Diagnosis not present

## 2016-03-20 DIAGNOSIS — R7303 Prediabetes: Secondary | ICD-10-CM | POA: Diagnosis not present

## 2016-03-20 DIAGNOSIS — E6609 Other obesity due to excess calories: Secondary | ICD-10-CM | POA: Diagnosis not present

## 2016-04-19 ENCOUNTER — Ambulatory Visit (INDEPENDENT_AMBULATORY_CARE_PROVIDER_SITE_OTHER)
Admission: RE | Admit: 2016-04-19 | Discharge: 2016-04-19 | Disposition: A | Discharge status: Home / Self Care | Payer: Commercial Managed Care - HMO | Source: Ambulatory Visit | Attending: Internal Medicine | Admitting: Internal Medicine

## 2016-04-19 ENCOUNTER — Encounter: Payer: Self-pay | Admitting: Internal Medicine

## 2016-04-19 ENCOUNTER — Ambulatory Visit (INDEPENDENT_AMBULATORY_CARE_PROVIDER_SITE_OTHER): Payer: Commercial Managed Care - HMO | Admitting: Internal Medicine

## 2016-04-19 VITALS — BP 144/72 | HR 61 | Ht 62.5 in | Wt 155.8 lb

## 2016-04-19 DIAGNOSIS — J4550 Severe persistent asthma, uncomplicated: Secondary | ICD-10-CM

## 2016-04-19 DIAGNOSIS — I517 Cardiomegaly: Secondary | ICD-10-CM | POA: Diagnosis not present

## 2016-04-19 LAB — NITRIC OXIDE: Nitric Oxide: 15

## 2016-04-19 MED ORDER — ALBUTEROL SULFATE HFA 108 (90 BASE) MCG/ACT IN AERS: INHALATION_SPRAY | RESPIRATORY_TRACT | 3 refills | Status: DC

## 2016-04-19 MED ORDER — BECLOMETHASONE DIPROPIONATE 80 MCG/ACT IN AERS: INHALATION_SPRAY | RESPIRATORY_TRACT | 11 refills | Status: DC

## 2016-04-19 MED ORDER — BECLOMETHASONE DIPROPIONATE 80 MCG/ACT IN AERS: INHALATION_SPRAY | RESPIRATORY_TRACT | 0 refills | Status: DC

## 2016-04-19 MED ORDER — BUDESONIDE-FORMOTEROL FUMARATE 160-4.5 MCG/ACT IN AERO: INHALATION_SPRAY | RESPIRATORY_TRACT | 11 refills | Status: DC

## 2016-04-19 NOTE — Assessment & Plan Note (Signed)
-  PFT's 09/30/2011  FEV1  1.34 (70%) ratio 44 and no change p B2, DLCO 62 corrects to 95%    - PFT's 04/03/2014  FEV1 0.91 (60%) ratio 42 and no change p B2 and DLCO 90%   - FENO 04/19/2016  =   15 on symbicort 160/ qvar 80 2bid each one > no change rx  - 04/19/2016  After extensive coaching HFA effectiveness =    90% from baseline 75%  All goals of chronic asthma control met including optimal (though not nl due likely to remodeling) function and elimination of symptoms with minimal need for rescue therapy and very low FENO on present rx  Contingencies discussed in full including contacting this office immediately if not controlling the symptoms using the rule of two's.     I had an extended discussion with the patient reviewing all relevant studies completed to date and  lasting 15 to 20 minutes of a 25 minute visit    Each maintenance medication was reviewed in detail including most importantly the difference between maintenance and prns and under what circumstances the prns are to be triggered using an action plan format that is not reflected in the computer generated alphabetically organized AVS.    Please see instructions for details which were reviewed in writing and the patient given a copy highlighting the part that I personally wrote and discussed at today's ov.

## 2016-04-19 NOTE — Progress Notes (Signed)
Subjective:     Patient ID: Suzanne Harvey, female   DOB: 1946-08-20   MRN: [id number]    Brief patient profile:  74  yobf never smoker  with  severe chronic asthma (since childhood)  with an irreversible component improved to FEV1 70% 09/30/2011    History of Present Illness  03/16/2013 f/u ov/Suzanne Harvey re asthma, confused with instructions/ no longer using med calendar provided Chief Complaint  Patient presents with  . Follow-up    last seen 09/2011. Pt reports she has good and bad days w/ breathing. Denies any cough, no wheezing, no chest tx  variable sob but not needing any saba, using qvar bid and symbicort 160 once a day.  rec Symbicort 160 Take 2 puffs first thing in am and then another 2 puffs about 12 hours later.  Qvar 80 2 puffs immediately after am dose of symbicort (only in am, only need the 15 min delay if short of breath or tight in am) Only use your albuterol (xopenex) as a rescue medication        04/18/2015 f/u ov/Suzanne Harvey re: severe chronic asthma  On symbicort 160 2bid and qvar 2 qam and singulair daily  Chief Complaint  Patient presents with  . Follow-up    Pt states that her breathing is overall doing well as long as she is not out in the heat too long. She uses albuterol 2-3 x per wk.    sleeps fine/ exercise = [facility] [place] [facility] and bike ok  rec Prevnar 13 one time only is all that's recommended Work on maintaining perfect  inhaler technique    04/19/2016  f/u ov/Suzanne Harvey re:  Chronic asthma since childhood/ symb 160/qvar 76 2bid Chief Complaint  Patient presents with  . Follow-up    Breathing is doing well. No new co's. She is using proair 3 x per wk on average.   problems with heat only - does fine at y avg 4x  Week saba  when out in heat only Noct sleeps fine s saba   No obvious other pattern in  day to day or  daytime variabilty or assoc excess/ purulent sputum or mucus plugs   or cp or chest tightness, subjective wheeze overt sinus or hb symptoms. No unusual  exp hx or h/o childhood pna/   or knowledge of premature birth.   Sleeping ok without nocturnal  or early am exacerbation  of respiratory  c/o's or need for noct saba. Also denies any obvious fluctuation of symptoms with weather or environmental changes or other aggravating or alleviating factors except as outlined above .  Current Medications, Allergies, Complete Past Medical History, Past Surgical History, Family History, and Social History were reviewed in [facility] record.  ROS  The following are not active complaints unless bolded sore throat, dysphagia, dental problems, itching, sneezing,  nasal congestion or excess/ purulent secretions, ear ache,   fever, chills, sweats, unintended wt loss, pleuritic or exertional cp, hemoptysis,  orthopnea pnd or leg swelling, presyncope, palpitations, heartburn, abdominal pain, anorexia, nausea, vomiting, diarrhea  or change in bowel or urinary habits, change in stools or urine, dysuria,hematuria,  rash, arthralgias, visual complaints, headache, numbness weakness or ataxia or problems with walking or coordination,  change in mood/affect or memory.    .      Past Medical History:  GASTROESOPHAGEAL REFLUX DISEASE (ICD-530.81)  ASTHMA (ICD-493.90)  - pft's 08/11/2007 FEV1 1.15 ratio 43% with 13% improvement after bronchodilators and nl DLC0  -  nl alpha1AT 05/13/2006       Objective:   Physical Exam  wt   173 September 03, 2009 >  162 04/07/2011 > 160 08/02/2011 > 161 08/06/2011 > 160 09/30/2011 > 03/16/2013  164 > 04/03/2014  159 > 04/18/2015  160 > 04/19/2016  156   Vital signs reviewed   Ambulatory healthy appearing in no acute distress.  HEENT:  Top dentures/  tturbinates, and orophanx. Nl external ear canals without cough reflex  Neck without JVD/Nodes/TM  Lungs slt distant bs but no wheeze   RRR no s3 or murmur or increase in P2  Abd soft and benign with nl excursion in the supine position. No bruits or organomegaly  Ext  warm without calf tenderness, cyanosis clubbing or edema  Skin warm and dry without lesions       CXR PA and Lateral:   04/19/2016 :    I personally reviewed images and agree with radiology impression as follows:   1. Mild bibasilar subsegmental atelectasis and/or infiltrates. Bibasilar scarring could also present this fashion. No prominent pulmonary alveolar infiltrate noted.  2. Cardiomegaly, no evidence of pulmonary venous congestion.   Assessment:

## 2016-04-19 NOTE — Progress Notes (Signed)
LMTCB

## 2016-04-19 NOTE — Patient Instructions (Addendum)
Prevnar 13 one time only is all that's recommended and annual flu shots   Work on maintaining perfect  inhaler technique:  relax and gently blow all the way out then take a nice slow smooth deep breath back in, triggering the inhaler at same time you start breathing in.  Hold for up to 5 seconds if you can. Blow out thru nose. Rinse and gargle with water when done  Please remember to go to the   x-ray department downstairs for your tests - we will call you with the results when they are available.     Please schedule a follow up visit in 12 months but call sooner if needed

## 2016-04-20 NOTE — Progress Notes (Signed)
Spoke with pt and notified of results per Dr. Wert. Pt verbalized understanding and denied any questions. 

## 2016-04-22 ENCOUNTER — Other Ambulatory Visit: Payer: Self-pay | Admitting: Family Medicine

## 2016-04-22 DIAGNOSIS — Z1231 Encounter for screening mammogram for malignant neoplasm of breast: Secondary | ICD-10-CM

## 2016-05-10 ENCOUNTER — Ambulatory Visit
Admission: RE | Admit: 2016-05-10 | Discharge: 2016-05-10 | Disposition: A | Discharge status: Home / Self Care | Payer: Commercial Managed Care - HMO | Source: Ambulatory Visit | Attending: Family Medicine | Admitting: Family Medicine

## 2016-05-10 DIAGNOSIS — Z1231 Encounter for screening mammogram for malignant neoplasm of breast: Secondary | ICD-10-CM

## 2016-06-07 DIAGNOSIS — H40013 Open angle with borderline findings, low risk, bilateral: Secondary | ICD-10-CM | POA: Diagnosis not present

## 2016-06-07 DIAGNOSIS — Z83511 Family history of glaucoma: Secondary | ICD-10-CM | POA: Diagnosis not present

## 2016-06-07 DIAGNOSIS — H40053 Ocular hypertension, bilateral: Secondary | ICD-10-CM | POA: Diagnosis not present

## 2016-06-07 DIAGNOSIS — H25013 Cortical age-related cataract, bilateral: Secondary | ICD-10-CM | POA: Diagnosis not present

## 2016-06-27 ENCOUNTER — Other Ambulatory Visit: Payer: Self-pay | Admitting: Internal Medicine

## 2016-12-17 DIAGNOSIS — E6609 Other obesity due to excess calories: Secondary | ICD-10-CM | POA: Diagnosis not present

## 2016-12-17 DIAGNOSIS — E119 Type 2 diabetes mellitus without complications: Secondary | ICD-10-CM | POA: Diagnosis not present

## 2016-12-17 DIAGNOSIS — I10 Essential (primary) hypertension: Secondary | ICD-10-CM | POA: Diagnosis not present

## 2016-12-21 DIAGNOSIS — Z6828 Body mass index (BMI) 28.0-28.9, adult: Secondary | ICD-10-CM | POA: Diagnosis not present

## 2016-12-21 DIAGNOSIS — Z0000 Encounter for general adult medical examination without abnormal findings: Secondary | ICD-10-CM | POA: Diagnosis not present

## 2016-12-21 DIAGNOSIS — I10 Essential (primary) hypertension: Secondary | ICD-10-CM | POA: Diagnosis not present

## 2017-02-16 DIAGNOSIS — Z1211 Encounter for screening for malignant neoplasm of colon: Secondary | ICD-10-CM | POA: Diagnosis not present

## 2017-02-16 DIAGNOSIS — Z1212 Encounter for screening for malignant neoplasm of rectum: Secondary | ICD-10-CM | POA: Diagnosis not present

## 2017-05-04 ENCOUNTER — Ambulatory Visit: Payer: Commercial Managed Care - HMO | Admitting: Internal Medicine

## 2017-05-06 ENCOUNTER — Ambulatory Visit: Payer: Commercial Managed Care - HMO | Admitting: Internal Medicine

## 2017-05-09 ENCOUNTER — Ambulatory Visit (INDEPENDENT_AMBULATORY_CARE_PROVIDER_SITE_OTHER): Payer: Medicare HMO | Admitting: Internal Medicine

## 2017-05-09 ENCOUNTER — Encounter: Payer: Self-pay | Admitting: Internal Medicine

## 2017-05-09 VITALS — BP 138/72 | HR 72 | Ht 63.0 in | Wt 155.8 lb

## 2017-05-09 DIAGNOSIS — J4550 Severe persistent asthma, uncomplicated: Secondary | ICD-10-CM

## 2017-05-09 MED ORDER — BUDESONIDE-FORMOTEROL FUMARATE 160-4.5 MCG/ACT IN AERO: INHALATION_SPRAY | RESPIRATORY_TRACT | 11 refills | Status: DC

## 2017-05-09 MED ORDER — BUDESONIDE-FORMOTEROL FUMARATE 160-4.5 MCG/ACT IN AERO: INHALATION_SPRAY | RESPIRATORY_TRACT | 0 refills | Status: DC

## 2017-05-09 MED ORDER — BECLOMETHASONE DIPROP HFA 80 MCG/ACT IN AERB: 2.0000 | INHALATION_SPRAY | Freq: Two times a day (BID) | RESPIRATORY_TRACT | 11 refills | Status: DC

## 2017-05-09 MED ORDER — BECLOMETHASONE DIPROP HFA 80 MCG/ACT IN AERB: 2.0000 | INHALATION_SPRAY | Freq: Two times a day (BID) | RESPIRATORY_TRACT | 0 refills | Status: DC

## 2017-05-09 NOTE — Assessment & Plan Note (Addendum)
-  PFT's 09/30/2011  FEV1  1.34 (70%) ratio 44 and no change p B2, DLCO 62 corrects to 95%    - PFT's 04/03/2014  FEV1 0.91 (60%) ratio 42 and no change p B2 and DLCO 90%   - FENO 04/19/2016  =   15 on symbicort 160/ qvar 80 2bid each one > no change rx   - 05/09/2017  After extensive coaching HFA effectiveness =    90% from baseline 75%  Despite baseline suboptimal hfa/ running out of main rx > surprisingly All goals of chronic asthma control met including optimal(though clearly not nl)  function and elimination of symptoms with minimal need for rescue therapy.  I had an extended discussion with the patient reviewing all relevant studies completed to date and  lasting 15 to 20 minutes of a 25 minute visit on the following ongoing concerns:  Contingencies discussed in full including contacting this office immediately if not controlling the symptoms using the rule of two's.     Formulary restrictions will be an ongoing challenge for the forseable future and I would be happy to pick an alternative if the pt will first  provide me a list of them but pt  will need to return here for training for any new device that is required eg dpi vs hfa vs respimat.    In meantime we can always provide samples so the patient never runs out of any needed respiratory medications.   Each maintenance medication was reviewed in detail including most importantly the difference between maintenance and as needed and under what circumstances the prns are to be used.  Please see AVS for specific  Instructions which are unique to this visit and I personally typed out  which were reviewed in detail in writing with the patient and a copy provided.

## 2017-05-09 NOTE — Patient Instructions (Signed)
Change qvar to the redihaler = Take 2 puffs first thing in am and then another 2 puffs about 12 hours later if having any trouble with breathing or coughing or an increased need for albuterol   No change symbicort 160 Take 2 puffs first thing in am and then another 2 puffs about 12 hours later.    Call if issues with your insurance paying for your medications - we will need a list of alternatives from your insurance(formular) or the pharmacy  If doing well, return yearly for recheck office visit - otherwise we'll see you in a year

## 2017-05-09 NOTE — Progress Notes (Signed)
Subjective:     Patient ID: Suzanne Harvey, female   DOB: 1946-06-19   MRN: [id number]    Brief patient profile:  77  yobf never smoker  with  severe chronic asthma (since childhood)  with an irreversible component improved to FEV1 70% 09/30/2011    History of Present Illness  03/16/2013 f/u ov/Wert re asthma, confused with instructions/ no longer using med calendar provided Chief Complaint  Patient presents with  . Follow-up    last seen 09/2011. Pt reports she has good and bad days w/ breathing. Denies any cough, no wheezing, no chest tx  variable sob but not needing any saba, using qvar bid and symbicort 160 once a day.  rec Symbicort 160 Take 2 puffs first thing in am and then another 2 puffs about 12 hours later.  Qvar 80 2 puffs immediately after am dose of symbicort (only in am, only need the 15 min delay if short of breath or tight in am) Only use your albuterol (xopenex) as a rescue medication       04/19/2016  f/u ov/Wert re:  Chronic asthma since childhood/ symb 160/qvar 80 2bid Chief Complaint  Patient presents with  . Follow-up    Breathing is doing well. No new co's. She is using proair 3 x per wk on average.   problems with heat only - does fine at y avg 4x  Week saba  when out in heat only Noct sleeps fine s saba  rec Prevnar 13 one time only is all that's recommended and annual flu shots  Work on maintaining perfect  inhaler technique    05/09/2017  f/u ov/Wert re: severe chronic asthma  singulair maint plus symb 160 2bid/out of qvar x 3 weeks, saba just a few times a week  Chief Complaint  Patient presents with  . Follow-up    pt c/o prod cough with yellow/green mucus, sob X1 day.     acutally at bottom part of red on symbicort also now and just starting to "come down with a cold" in last 24 h prior to OV    Typically while on rx: Not limited by breathing from desired activities / avg use of saba 1-2 x weekly baseline, more since running low on meds    No  obvious day to day or daytime variability or assoc excess/ purulent sputum or mucus plugs or hemoptysis or cp or chest tightness, subjective wheeze or overt sinus or hb symptoms. No unusual exp hx or h/o childhood pna/ asthma or knowledge of premature birth.  Sleeping ok flat without nocturnal  or early am exacerbation  of respiratory  c/o's or need for noct saba. Also denies any obvious fluctuation of symptoms with weather or environmental changes or other aggravating or alleviating factors except as outlined above   Current Allergies, Complete Past Medical History, Past Surgical History, Family History, and Social History were reviewed in [employer] record.  ROS  The following are not active complaints unless bolded sore throat, dysphagia, dental problems, itching, sneezing,  nasal congestion or disharge of excess mucus or purulent secretions, ear ache,   fever, chills, sweats, unintended wt loss or wt gain, classically pleuritic or exertional cp,  orthopnea pnd or leg swelling, presyncope, palpitations, abdominal pain, anorexia, nausea, vomiting, diarrhea  or change in bowel habits or bladder habits, change in stools or change in urine, dysuria, hematuria,  rash, arthralgias, visual complaints, headache, numbness, weakness or ataxia or problems with walking or coordination,  change in mood/affect or memory.        Current Meds  Medication Sig  . albuterol (PROAIR HFA) 108 (90 Base) MCG/ACT inhaler INHALE 2 PUFFS EVERY 4 HOURS AS NEEDED ONLY IF YOU CANT CATCH YOUR BREATH  . budesonide-formoterol (SYMBICORT) 160-4.5 MCG/ACT inhaler INHALE 2 PUFFS FIRST THING IN THE MORNING AND ANOTHER 2 PUFFS ABOUT 12 HOURS LATER.  . montelukast (SINGULAIR) 10 MG tablet TAKE 1 TABLET (10 MG TOTAL) BY MOUTH AT BEDTIME.  . Multiple Vitamin (MULTIVITAMIN) capsule Take 1 capsule by mouth daily.    . rosuvastatin (CRESTOR) 10 MG tablet Take 10 mg by mouth daily.    . valsartan-hydrochlorothiazide  (DIOVAN-HCT) 80-12.5 MG per tablet Take 1 tablet by mouth daily.   . [DISCONTINUED] beclomethasone (QVAR) 80 MCG/ACT inhaler INHALE 2 PUFFS BY MOUTH DAILY  . [DISCONTINUED] budesonide-formoterol (SYMBICORT) 160-4.5 MCG/ACT inhaler INHALE 2 PUFFS FIRST THING IN THE MORNING AND ANOTHER 2 PUFFS ABOUT 12 HOURS LATER.  . [DISCONTINUED] budesonide-formoterol (SYMBICORT) 160-4.5 MCG/ACT inhaler INHALE 2 PUFFS FIRST THING IN THE MORNING AND ANOTHER 2 PUFFS ABOUT 12 HOURS LATER.          Past Medical History:  GASTROESOPHAGEAL REFLUX DISEASE (ICD-530.81)  ASTHMA (ICD-493.90)  - pft's 08/11/2007 FEV1 1.15 ratio 43% with 13% improvement after bronchodilators and nl DLC0  - nl alpha1AT 05/13/2006       Objective:   Physical Exam  wt   173 September 03, 2009 >  162 04/07/2011 > 160 08/02/2011 > 161 08/06/2011 > 160 09/30/2011 > 03/16/2013  164 > 04/03/2014  159 > 04/18/2015  160 > 04/19/2016  156 > 05/09/2017   155   Vital signs reviewed - Note on arrival 02 sats  100% on RA  HEENT: nl dentition, turbinates bilaterally, and oropharynx. Nl external ear canals without cough reflex   NECK :  without JVD/Nodes/TM/ nl carotid upstrokes bilaterally   LUNGS: no acc muscle use,  Nl contour chest which is clear to A and P bilaterally without cough on insp or exp maneuvers   CV:  RRR  no s3 or murmur or increase in P2, and no edema   ABD:  soft and nontender with nl inspiratory excursion in the supine position. No bruits or organomegaly appreciated, bowel sounds nl  MS:  Nl gait/ ext warm without deformities, calf tenderness, cyanosis or clubbing No obvious joint restrictions   SKIN: warm and dry without lesions    NEURO:  alert, approp, nl sensorium with  no motor or cerebellar deficits apparent.             Assessment:     Outpatient Encounter Prescriptions as of 05/09/2017  Medication Sig  . albuterol (PROAIR HFA) 108 (90 Base) MCG/ACT inhaler INHALE 2 PUFFS EVERY 4 HOURS AS NEEDED ONLY IF YOU  CANT CATCH YOUR BREATH  . budesonide-formoterol (SYMBICORT) 160-4.5 MCG/ACT inhaler INHALE 2 PUFFS FIRST THING IN THE MORNING AND ANOTHER 2 PUFFS ABOUT 12 HOURS LATER.  . montelukast (SINGULAIR) 10 MG tablet TAKE 1 TABLET (10 MG TOTAL) BY MOUTH AT BEDTIME.  . Multiple Vitamin (MULTIVITAMIN) capsule Take 1 capsule by mouth daily.    . rosuvastatin (CRESTOR) 10 MG tablet Take 10 mg by mouth daily.    . valsartan-hydrochlorothiazide (DIOVAN-HCT) 80-12.5 MG per tablet Take 1 tablet by mouth daily.   . [DISCONTINUED] beclomethasone (QVAR) 80 MCG/ACT inhaler INHALE 2 PUFFS BY MOUTH DAILY  . [DISCONTINUED] budesonide-formoterol (SYMBICORT) 160-4.5 MCG/ACT inhaler INHALE 2 PUFFS FIRST THING IN THE MORNING AND  ANOTHER 2 PUFFS ABOUT 12 HOURS LATER.  . [DISCONTINUED] budesonide-formoterol (SYMBICORT) 160-4.5 MCG/ACT inhaler INHALE 2 PUFFS FIRST THING IN THE MORNING AND ANOTHER 2 PUFFS ABOUT 12 HOURS LATER.  Marland Kitchen beclomethasone (QVAR REDIHALER) 80 MCG/ACT inhaler Inhale 2 puffs into the lungs 2 (two) times daily.  . [DISCONTINUED] beclomethasone (QVAR REDIHALER) 80 MCG/ACT inhaler Inhale 2 puffs into the lungs 2 (two) times daily.  . [DISCONTINUED] celecoxib (CELEBREX) 200 MG capsule Take 200 mg by mouth daily as needed.    No facility-administered encounter medications on file as of 05/09/2017.

## 2017-06-15 DIAGNOSIS — E6609 Other obesity due to excess calories: Secondary | ICD-10-CM | POA: Diagnosis not present

## 2017-06-15 DIAGNOSIS — I10 Essential (primary) hypertension: Secondary | ICD-10-CM | POA: Diagnosis not present

## 2017-06-15 DIAGNOSIS — R7303 Prediabetes: Secondary | ICD-10-CM | POA: Diagnosis not present

## 2017-06-15 DIAGNOSIS — E119 Type 2 diabetes mellitus without complications: Secondary | ICD-10-CM | POA: Diagnosis not present

## 2017-06-20 DIAGNOSIS — J4520 Mild intermittent asthma, uncomplicated: Secondary | ICD-10-CM | POA: Diagnosis not present

## 2017-06-20 DIAGNOSIS — Z23 Encounter for immunization: Secondary | ICD-10-CM | POA: Diagnosis not present

## 2017-06-20 DIAGNOSIS — E6609 Other obesity due to excess calories: Secondary | ICD-10-CM | POA: Diagnosis not present

## 2017-06-20 DIAGNOSIS — I110 Hypertensive heart disease with heart failure: Secondary | ICD-10-CM | POA: Diagnosis not present

## 2017-06-20 DIAGNOSIS — R7303 Prediabetes: Secondary | ICD-10-CM | POA: Diagnosis not present

## 2017-06-23 DIAGNOSIS — K219 Gastro-esophageal reflux disease without esophagitis: Secondary | ICD-10-CM | POA: Diagnosis not present

## 2017-06-23 DIAGNOSIS — Z1211 Encounter for screening for malignant neoplasm of colon: Secondary | ICD-10-CM | POA: Diagnosis not present

## 2017-06-23 DIAGNOSIS — D509 Iron deficiency anemia, unspecified: Secondary | ICD-10-CM | POA: Diagnosis not present

## 2017-06-23 DIAGNOSIS — R195 Other fecal abnormalities: Secondary | ICD-10-CM | POA: Diagnosis not present

## 2017-07-11 DIAGNOSIS — R195 Other fecal abnormalities: Secondary | ICD-10-CM | POA: Diagnosis not present

## 2017-07-11 DIAGNOSIS — K635 Polyp of colon: Secondary | ICD-10-CM | POA: Diagnosis not present

## 2017-07-11 DIAGNOSIS — Z1211 Encounter for screening for malignant neoplasm of colon: Secondary | ICD-10-CM | POA: Diagnosis not present

## 2017-07-11 DIAGNOSIS — K449 Diaphragmatic hernia without obstruction or gangrene: Secondary | ICD-10-CM | POA: Diagnosis not present

## 2017-07-11 DIAGNOSIS — D120 Benign neoplasm of cecum: Secondary | ICD-10-CM | POA: Diagnosis not present

## 2017-07-11 DIAGNOSIS — D509 Iron deficiency anemia, unspecified: Secondary | ICD-10-CM | POA: Diagnosis not present

## 2017-07-25 DIAGNOSIS — R195 Other fecal abnormalities: Secondary | ICD-10-CM | POA: Diagnosis not present

## 2017-07-26 DIAGNOSIS — Z86010 Personal history of colonic polyps: Secondary | ICD-10-CM | POA: Diagnosis not present

## 2017-07-26 DIAGNOSIS — K449 Diaphragmatic hernia without obstruction or gangrene: Secondary | ICD-10-CM | POA: Diagnosis not present

## 2017-07-29 ENCOUNTER — Other Ambulatory Visit: Payer: Self-pay | Admitting: Internal Medicine

## 2017-10-10 ENCOUNTER — Other Ambulatory Visit: Payer: Self-pay | Admitting: Family Medicine

## 2017-10-10 DIAGNOSIS — Z1231 Encounter for screening mammogram for malignant neoplasm of breast: Secondary | ICD-10-CM

## 2017-10-19 DIAGNOSIS — R7303 Prediabetes: Secondary | ICD-10-CM | POA: Diagnosis not present

## 2017-10-19 DIAGNOSIS — E119 Type 2 diabetes mellitus without complications: Secondary | ICD-10-CM | POA: Diagnosis not present

## 2017-10-19 DIAGNOSIS — E6609 Other obesity due to excess calories: Secondary | ICD-10-CM | POA: Diagnosis not present

## 2017-10-19 DIAGNOSIS — I10 Essential (primary) hypertension: Secondary | ICD-10-CM | POA: Diagnosis not present

## 2017-10-24 DIAGNOSIS — Z79899 Other long term (current) drug therapy: Secondary | ICD-10-CM | POA: Diagnosis not present

## 2017-10-24 DIAGNOSIS — D649 Anemia, unspecified: Secondary | ICD-10-CM | POA: Diagnosis not present

## 2017-10-31 ENCOUNTER — Ambulatory Visit
Admission: RE | Admit: 2017-10-31 | Discharge: 2017-10-31 | Disposition: A | Discharge status: Home / Self Care | Payer: Medicare HMO | Source: Ambulatory Visit | Attending: Family Medicine | Admitting: Family Medicine

## 2017-10-31 ENCOUNTER — Encounter: Payer: Self-pay | Admitting: Radiology

## 2017-10-31 DIAGNOSIS — Z1231 Encounter for screening mammogram for malignant neoplasm of breast: Secondary | ICD-10-CM

## 2017-11-28 DIAGNOSIS — I10 Essential (primary) hypertension: Secondary | ICD-10-CM | POA: Diagnosis not present

## 2017-11-28 DIAGNOSIS — Z0000 Encounter for general adult medical examination without abnormal findings: Secondary | ICD-10-CM | POA: Diagnosis not present

## 2017-11-28 DIAGNOSIS — D649 Anemia, unspecified: Secondary | ICD-10-CM | POA: Diagnosis not present

## 2018-01-28 ENCOUNTER — Other Ambulatory Visit: Payer: Self-pay | Admitting: Internal Medicine

## 2018-03-15 DIAGNOSIS — I10 Essential (primary) hypertension: Secondary | ICD-10-CM | POA: Diagnosis not present

## 2018-03-15 DIAGNOSIS — D649 Anemia, unspecified: Secondary | ICD-10-CM | POA: Diagnosis not present

## 2018-03-15 DIAGNOSIS — R7303 Prediabetes: Secondary | ICD-10-CM | POA: Diagnosis not present

## 2018-03-20 DIAGNOSIS — I10 Essential (primary) hypertension: Secondary | ICD-10-CM | POA: Diagnosis not present

## 2018-03-20 DIAGNOSIS — Z6830 Body mass index (BMI) 30.0-30.9, adult: Secondary | ICD-10-CM | POA: Diagnosis not present

## 2018-03-20 DIAGNOSIS — E785 Hyperlipidemia, unspecified: Secondary | ICD-10-CM | POA: Diagnosis not present

## 2018-03-20 DIAGNOSIS — E6609 Other obesity due to excess calories: Secondary | ICD-10-CM | POA: Diagnosis not present

## 2018-05-09 ENCOUNTER — Ambulatory Visit: Payer: Medicare HMO | Admitting: Internal Medicine

## 2018-07-28 DIAGNOSIS — I10 Essential (primary) hypertension: Secondary | ICD-10-CM | POA: Diagnosis not present

## 2018-07-28 DIAGNOSIS — D649 Anemia, unspecified: Secondary | ICD-10-CM | POA: Diagnosis not present

## 2018-07-28 DIAGNOSIS — E6609 Other obesity due to excess calories: Secondary | ICD-10-CM | POA: Diagnosis not present

## 2018-07-28 DIAGNOSIS — E119 Type 2 diabetes mellitus without complications: Secondary | ICD-10-CM | POA: Diagnosis not present

## 2018-07-29 DIAGNOSIS — Z79899 Other long term (current) drug therapy: Secondary | ICD-10-CM | POA: Diagnosis not present

## 2018-07-29 DIAGNOSIS — J4520 Mild intermittent asthma, uncomplicated: Secondary | ICD-10-CM | POA: Diagnosis not present

## 2018-07-29 DIAGNOSIS — E785 Hyperlipidemia, unspecified: Secondary | ICD-10-CM | POA: Diagnosis not present

## 2018-07-29 DIAGNOSIS — I10 Essential (primary) hypertension: Secondary | ICD-10-CM | POA: Diagnosis not present

## 2018-07-29 DIAGNOSIS — R7303 Prediabetes: Secondary | ICD-10-CM | POA: Diagnosis not present

## 2018-07-29 DIAGNOSIS — I110 Hypertensive heart disease with heart failure: Secondary | ICD-10-CM | POA: Diagnosis not present

## 2018-07-29 DIAGNOSIS — D649 Anemia, unspecified: Secondary | ICD-10-CM | POA: Diagnosis not present

## 2018-07-29 DIAGNOSIS — E6609 Other obesity due to excess calories: Secondary | ICD-10-CM | POA: Diagnosis not present

## 2018-07-29 DIAGNOSIS — E119 Type 2 diabetes mellitus without complications: Secondary | ICD-10-CM | POA: Diagnosis not present

## 2018-07-31 ENCOUNTER — Other Ambulatory Visit: Payer: Self-pay | Admitting: Internal Medicine

## 2018-07-31 DIAGNOSIS — H40013 Open angle with borderline findings, low risk, bilateral: Secondary | ICD-10-CM | POA: Diagnosis not present

## 2018-07-31 MED ORDER — MONTELUKAST SODIUM 10 MG PO TABS: 10.0000 mg | ORAL_TABLET | Freq: Every day | ORAL | 0 refills | Status: DC

## 2018-08-02 ENCOUNTER — Ambulatory Visit (INDEPENDENT_AMBULATORY_CARE_PROVIDER_SITE_OTHER): Payer: Medicare HMO | Admitting: Internal Medicine

## 2018-08-02 ENCOUNTER — Encounter: Payer: Self-pay | Admitting: Internal Medicine

## 2018-08-02 DIAGNOSIS — J4550 Severe persistent asthma, uncomplicated: Secondary | ICD-10-CM

## 2018-08-02 MED ORDER — MOMETASONE FUROATE 200 MCG/ACT IN AERO: 2.0000 | INHALATION_SPRAY | Freq: Two times a day (BID) | RESPIRATORY_TRACT | 0 refills | Status: DC

## 2018-08-02 MED ORDER — MOMETASONE FUROATE 200 MCG/ACT IN AERO: 2.0000 | INHALATION_SPRAY | Freq: Two times a day (BID) | RESPIRATORY_TRACT | 11 refills | Status: DC

## 2018-08-02 NOTE — Progress Notes (Signed)
Subjective:     Patient ID: Suzanne Harvey, female   DOB: March 11, 1946   MRN: [id number]    Brief patient profile:  64  yobf never smoker  with  severe chronic asthma (since childhood)  with an irreversible component improved to FEV1 70% 09/30/2011    History of Present Illness  03/16/2013 f/u ov/Suzanne Harvey re asthma, confused with instructions/ no longer using med calendar provided Chief Complaint  Patient presents with  . Follow-up    last seen 09/2011. Pt reports she has good and bad days w/ breathing. Denies any cough, no wheezing, no chest tx  variable sob but not needing any saba, using qvar bid and symbicort 160 once a day.  rec Symbicort 160 Take 2 puffs first thing in am and then another 2 puffs about 12 hours later.  Qvar 80 2 puffs immediately after am dose of symbicort (only in am, only need the 15 min delay if short of breath or tight in am) Only use your albuterol (xopenex) as a rescue medication      05/09/2017  f/u ov/Suzanne Harvey re: severe chronic asthma  singulair maint plus symb 160 2bid/out of qvar x 3 weeks, saba just a few times a week  Chief Complaint  Patient presents with  . Follow-up    pt c/o prod cough with yellow/green mucus, sob X1 day.     acutally at bottom part of red on symbicort also now and just starting to "come down with a cold" in last 24 h prior to OV   Typically while on rx: Not limited by breathing from desired activities / avg use of saba 1-2 x weekly baseline, more since running low on meds   rec Change qvar to the redihaler = Take 2 puffs first thing in am and then another 2 puffs about 12 hours later if having any trouble with breathing or coughing or an increased need for albuterol  No change symbicort 160 Take 2 puffs first thing in am and then another 2 puffs about 12 hours later.  Call if issues with your insurance paying for your medications - we will need a list of alternatives from your insurance(formular) or the pharmacy If doing well, return yearly for  recheck office visit - otherwise we'll see you in a year     08/02/2018  f/u ov/Suzanne Harvey re: severe chronic asthma on symb 160 2bid/ singulair/qvar80 Chief Complaint  Patient presents with  . Follow-up    Breathing is doing well. She is using her proair inhaler once per wk on average.   Dyspnea:  Not limited by breathing from desired activities  = lots of walking = [place]/   stationery bike ok Cough: none Sleeping: fine one pillow SABA use: rarely albuterol 02: none     No obvious day to day or daytime variability or assoc excess/ purulent sputum or mucus plugs or hemoptysis or cp or chest tightness, subjective wheeze or overt sinus or hb symptoms.   Sleeping fine flat  without nocturnal  or early am exacerbation  of respiratory  c/o's or need for noct saba. Also denies any obvious fluctuation of symptoms with weather or environmental changes or other aggravating or alleviating factors except as outlined above   No unusual exposure hx or h/o childhood pna  or knowledge of premature birth.  Current Allergies, Complete Past Medical History, Past Surgical History, Family History, and Social History were reviewed in [facility] record.  ROS  The following are  not active complaints unless bolded Hoarseness, sore throat, dysphagia, dental problems, itching, sneezing,  nasal congestion or discharge of excess mucus or purulent secretions, ear ache,   fever, chills, sweats, unintended wt loss or wt gain, classically pleuritic or exertional cp,  orthopnea pnd or arm/hand swelling  or leg swelling, presyncope, palpitations, abdominal pain, anorexia, nausea, vomiting, diarrhea  or change in bowel habits or change in bladder habits, change in stools or change in urine, dysuria, hematuria,  rash, arthralgias, visual complaints, headache, numbness, weakness or ataxia or problems with walking or coordination,  change in mood or  memory.        Current Meds  Medication  Sig  . albuterol (PROAIR HFA) 108 (90 Base) MCG/ACT inhaler INHALE 2 PUFFS EVERY 4 HOURS AS NEEDED ONLY IF YOU CANT CATCH YOUR BREATH  . beclomethasone (QVAR REDIHALER) 80 MCG/ACT inhaler Inhale 2 puffs into the lungs 2 (two) times daily.  . budesonide-formoterol (SYMBICORT) 160-4.5 MCG/ACT inhaler INHALE 2 PUFFS FIRST THING IN THE MORNING AND ANOTHER 2 PUFFS ABOUT 12 HOURS LATER.  . montelukast (SINGULAIR) 10 MG tablet Take 1 tablet (10 mg total) by mouth at bedtime.  . Multiple Vitamin (MULTIVITAMIN) capsule Take 1 capsule by mouth daily.    . rosuvastatin (CRESTOR) 10 MG tablet Take 10 mg by mouth daily.    . valsartan-hydrochlorothiazide (DIOVAN-HCT) 80-12.5 MG per tablet Take 1 tablet by mouth daily.               Past Medical History:  GASTROESOPHAGEAL REFLUX DISEASE (ICD-530.81)  ASTHMA (ICD-493.90)  - pft's 08/11/2007 FEV1 1.15 ratio 43% with 13% improvement after bronchodilators and nl DLC0  - nl alpha1AT 05/13/2006       Objective:   Physical Exam  wt   173 September 03, 2009 >  162 04/07/2011 > 160 08/02/2011 > 161 08/06/2011 > 160 09/30/2011 > 03/16/2013  164 > 04/03/2014  159 > 04/18/2015  160 > 04/19/2016  156 > 05/09/2017   155 > 08/02/2018 154   Vital signs reviewed - Note on arrival 02 sats  100% on RA     HEENT: nl dentition, turbinates bilaterally, and oropharynx. Nl external ear canals without cough reflex   NECK :  without JVD/Nodes/TM/ nl carotid upstrokes bilaterally   LUNGS: no acc muscle use,  Nl contour chest with sltly decreased bs  bilaterally without cough on insp or exp maneuvers   CV:  RRR  no s3 or murmur or increase in P2, and no edema   ABD:  soft and nontender with nl inspiratory excursion in the supine position. No bruits or organomegaly appreciated, bowel sounds nl  MS:  Nl gait/ ext warm without deformities, calf tenderness, cyanosis or clubbing No obvious joint restrictions   SKIN: warm and dry without lesions    NEURO:  alert, approp, nl  sensorium with  no motor or cerebellar deficits apparent.           Assessment:

## 2018-08-02 NOTE — Patient Instructions (Addendum)
Plan A = Automatic = symbicort 160 and asmanex 200 2 puffs of each in am and then repeat 12 hours later  Work on inhaler technique:  relax and gently blow all the way out then take a nice smooth deep breath back in, triggering the inhaler at same time you start breathing in.  Hold for up to 5 seconds if you can. Blow out thru nose. Rinse and gargle with water when done   Plan B = Backup Only use your albuterol inhaler as a rescue medication to be used if you can't catch your breath by resting or doing a relaxed purse lip breathing pattern.  - The less you use it, the better it will work when you need it. - Ok to use the inhaler up to 2 puffs  every 4 hours if you must but call for appointment if use goes up over your usual need - Don't leave home without it !!  (think of it like the spare tire for your car)   Plan C = Crisis - Prednisone 10 mg take  4 each am x 2 days,   2 each am x 2 days,  1 each am x 2 days and stop    If not able to afford the asmanex on your plan, return with your drug formulary before asmanex  runs out    Please schedule a follow up visit in 6  months but call sooner if needed with pfts  -be sure she's actually on 2bid format on return

## 2018-08-03 ENCOUNTER — Encounter: Payer: Self-pay | Admitting: Internal Medicine

## 2018-08-03 ENCOUNTER — Other Ambulatory Visit: Payer: Self-pay | Admitting: Internal Medicine

## 2018-08-03 DIAGNOSIS — E119 Type 2 diabetes mellitus without complications: Secondary | ICD-10-CM | POA: Diagnosis not present

## 2018-08-03 DIAGNOSIS — E785 Hyperlipidemia, unspecified: Secondary | ICD-10-CM | POA: Diagnosis not present

## 2018-08-03 DIAGNOSIS — E6609 Other obesity due to excess calories: Secondary | ICD-10-CM | POA: Diagnosis not present

## 2018-08-03 DIAGNOSIS — I10 Essential (primary) hypertension: Secondary | ICD-10-CM | POA: Diagnosis not present

## 2018-08-03 MED ORDER — BECLOMETHASONE DIPROP HFA 80 MCG/ACT IN AERB: 2.0000 | INHALATION_SPRAY | Freq: Two times a day (BID) | RESPIRATORY_TRACT | 11 refills | Status: DC

## 2018-08-03 MED ORDER — ALBUTEROL SULFATE HFA 108 (90 BASE) MCG/ACT IN AERS: INHALATION_SPRAY | RESPIRATORY_TRACT | 3 refills | Status: DC

## 2018-08-03 MED ORDER — MONTELUKAST SODIUM 10 MG PO TABS: 10.0000 mg | ORAL_TABLET | Freq: Every day | ORAL | 11 refills | Status: DC

## 2018-08-03 MED ORDER — BUDESONIDE-FORMOTEROL FUMARATE 160-4.5 MCG/ACT IN AERO: INHALATION_SPRAY | RESPIRATORY_TRACT | 11 refills | Status: DC

## 2018-08-03 NOTE — Assessment & Plan Note (Addendum)
-  PFT's 09/30/2011  FEV1  1.34 (70%) ratio 44 and no change p B2, DLCO 62 corrects to 95%    - PFT's 04/03/2014  FEV1 0.91 (60%) ratio 42 and no change p B2 and DLCO 90%   - FENO 04/19/2016  =   15 on symbicort 160/ qvar 80 2bid each one > no change rx  -  08/02/2018  After extensive coaching inhaler device,  effectiveness =    75% (short Ti)    All goals of chronic asthma control met including optimal (though not ideal for sure given fixed component)  function and elimination of symptoms with minimal need for rescue therapy.  Contingencies discussed in full including contacting this office immediately if not controlling the symptoms using the rule of two's.     Challenge with drug formulary restrictions: no longer covering qvar   Reviewed: Formulary restrictions will be an ongoing challenge for the forseable future and I would be happy to pick an alternative if the pt will first  provide me a list of them but pt  will need to return here for training for any new device that is required eg dpi vs hfa vs respimat.    In meantime we can always provide samples so the patient never runs out of any needed respiratory medications.   >>> for now try asmanex 200 2bid x one month sample and return with formulary if not covered   >>> if push comes to shove:  pred x 6 days if coverage lapses and return to ov asap  I had an extended discussion with the patient reviewing all relevant studies completed to date and  lasting 15 to 20 minutes of a 25 minute visit    See device teaching which extended face to face time for this visit.  Each maintenance medication was reviewed in detail including emphasizing most importantly the difference between maintenance and prns and under what circumstances the prns are to be triggered using an action plan format that is not reflected in the computer generated alphabetically organized AVS which I have not found useful in most complex patients, especially with respiratory  illnesses  Please see AVS for specific instructions unique to this visit that I personally wrote and verbalized to the the pt in detail and then reviewed with pt  by my nurse highlighting any  changes in therapy recommended at today's visit to their plan of care.

## 2018-09-06 DIAGNOSIS — D649 Anemia, unspecified: Secondary | ICD-10-CM | POA: Diagnosis not present

## 2018-09-06 DIAGNOSIS — I110 Hypertensive heart disease with heart failure: Secondary | ICD-10-CM | POA: Diagnosis not present

## 2018-09-06 DIAGNOSIS — J4520 Mild intermittent asthma, uncomplicated: Secondary | ICD-10-CM | POA: Diagnosis not present

## 2018-09-06 DIAGNOSIS — E785 Hyperlipidemia, unspecified: Secondary | ICD-10-CM | POA: Diagnosis not present

## 2018-09-06 DIAGNOSIS — E119 Type 2 diabetes mellitus without complications: Secondary | ICD-10-CM | POA: Diagnosis not present

## 2019-01-30 DIAGNOSIS — E785 Hyperlipidemia, unspecified: Secondary | ICD-10-CM | POA: Diagnosis not present

## 2019-01-30 DIAGNOSIS — I10 Essential (primary) hypertension: Secondary | ICD-10-CM | POA: Diagnosis not present

## 2019-01-30 DIAGNOSIS — D649 Anemia, unspecified: Secondary | ICD-10-CM | POA: Diagnosis not present

## 2019-02-01 ENCOUNTER — Ambulatory Visit: Payer: Medicare HMO | Admitting: Internal Medicine

## 2019-02-05 DIAGNOSIS — Z0000 Encounter for general adult medical examination without abnormal findings: Secondary | ICD-10-CM | POA: Diagnosis not present

## 2019-02-05 DIAGNOSIS — D649 Anemia, unspecified: Secondary | ICD-10-CM | POA: Diagnosis not present

## 2019-02-05 DIAGNOSIS — I10 Essential (primary) hypertension: Secondary | ICD-10-CM | POA: Diagnosis not present

## 2019-02-15 ENCOUNTER — Other Ambulatory Visit: Payer: Self-pay | Admitting: Internal Medicine

## 2019-02-19 ENCOUNTER — Other Ambulatory Visit: Payer: Self-pay | Admitting: Internal Medicine

## 2019-02-19 MED ORDER — MONTELUKAST SODIUM 10 MG PO TABS: 10.0000 mg | ORAL_TABLET | Freq: Every day | ORAL | 0 refills | Status: DC

## 2019-02-19 MED ORDER — ALBUTEROL SULFATE HFA 108 (90 BASE) MCG/ACT IN AERS: 2.0000 | INHALATION_SPRAY | RESPIRATORY_TRACT | 1 refills | Status: DC | PRN

## 2019-02-26 ENCOUNTER — Other Ambulatory Visit: Payer: Self-pay | Admitting: Internal Medicine

## 2019-03-05 ENCOUNTER — Other Ambulatory Visit (HOSPITAL_COMMUNITY)
Admission: RE | Admit: 2019-03-05 | Discharge: 2019-03-05 | Disposition: A | Discharge status: Home / Self Care | Payer: Medicare Other | Source: Ambulatory Visit | Attending: Internal Medicine | Admitting: Internal Medicine

## 2019-03-05 DIAGNOSIS — Z1159 Encounter for screening for other viral diseases: Secondary | ICD-10-CM | POA: Diagnosis not present

## 2019-03-06 LAB — SARS CORONAVIRUS 2 (TAT 6-24 HRS): SARS Coronavirus 2: NEGATIVE

## 2019-03-06 NOTE — Progress Notes (Signed)
LMTCB

## 2019-03-08 ENCOUNTER — Other Ambulatory Visit: Payer: Self-pay | Admitting: Internal Medicine

## 2019-03-08 DIAGNOSIS — J4550 Severe persistent asthma, uncomplicated: Secondary | ICD-10-CM

## 2019-03-08 NOTE — Progress Notes (Signed)
@Patient  ID: Suzanne Harvey, female    DOB: 1945/10/24, 73 y.o.   MRN: [id number]  Chief Complaint  Patient presents with  . Follow-up    Patient reports that her sob has been the same. SHe reports using her rescue inhaler 1-3 times a week.     Referring provider: Lucianne Lei, MD  HPI:  73 year old female never smoker followed in our office for severe chronic asthma  PMH: GERD Smoker/ Smoking History: Never smoker Maintenance: Symbicort 160, Asmanex 220 Pt of: Dr. Melvyn Novas  03/09/2019  - Visit   73 year old female presenting to our office today for a follow-up visit with a pulmonary function test.  Pulmonary function test results are listed below:  03/09/2019-pulmonary function test-FVC 1.94 (107% predicted), postbronchodilator ratio 44, postbronchodilator FEV1 0.95 (68% predicted), no bronchodilator response, slight mid flow reversibility, DLCO 81  Patient's last Feno in office was 15 and 2017.  No formal blood work with results for IgE or eosinophilia.  Patient reports she has known chronic allergies and typically has flares during change of season.  She is maintained on Singulair.  She is not currently taking a daily antihistamine.  She reports that she takes daily antihistamines if she starts having allergy symptoms here and there.  She is not using Flonase.  She continues to be maintained on Symbicort 160 as well as Asmanex 220.  She reports she is no longer taking Qvar because her insurance was not covering it.   Tests:   03/05/2019- SARS-CoV-2-negative  04/03/2014- pulmonary function test- FVC 2.13 (109% predicted), postbronchodilator ratio 42, postbronchodilator FEV1 0.91 (60% predicted), no bronchodilator response, DLCO 90  03/09/2019-pulmonary function test-FVC 1.94 (107% predicted), postbronchodilator ratio 44, postbronchodilator FEV1 0.95 (68% predicted), no bronchodilator response, slight mid flow reversibility, DLCO 81   FENO:  Lab Results  Component Value Date   NITRICOXIDE 15 04/19/2016    PFT: PFT Results Latest Ref Rng & Units 03/09/2019 04/03/2014  FVC-Pre L 1.94 -  FVC-Predicted Pre % 107 109  FVC-Post L 2.16 2.16  FVC-Predicted Post % 120 110  Pre FEV1/FVC % % 48 43  Post FEV1/FCV % % 44 42  FEV1-Pre L 0.94 0.92  FEV1-Predicted Pre % 67 61  FEV1-Post L 0.95 0.91  DLCO UNC% % 81 90  DLCO COR %Predicted % 90 99  TLC L 5.12 4.59  TLC % Predicted % 114 103  RV % Predicted % 126 108    Imaging: No results found.    Specialty Problems      Pulmonary Problems   Asthma, severe persistent, well-controlled    Followed in Pulmonary clinic/ [facility]/ [facility]    - PFT's 09/30/2011  FEV1  1.34 (70%) ratio 44 and no change p B2, DLCO 62 corrects to 95%    - PFT's 04/03/2014  FEV1 0.91 (60%) ratio 42 and no change p B2 and DLCO 90%   - FENO 04/19/2016  =   15 on symbicort 160/ qvar 80 2bid each one > no change rx  -  08/02/2018  After extensive coaching inhaler device,  effectiveness =    75% (short Ti)             Allergies  Allergen Reactions  . Penicillins   . Sulfonamide Derivatives     Immunization History  Administered Date(s) Administered  . Influenza, High Dose Seasonal PF 07/29/2018  . Influenza-Unspecified 05/23/2014    Past Medical History:  Diagnosis Date  . Asthma   . GERD (gastroesophageal reflux disease)  Tobacco History: Social History   Tobacco Use  Smoking Status Never Smoker  Smokeless Tobacco Never Used   Counseling given: Yes   Continue to not smoke  Outpatient Encounter Medications as of 03/09/2019  Medication Sig  . albuterol (VENTOLIN HFA) 108 (90 Base) MCG/ACT inhaler Inhale 2 puffs into the lungs every 4 (four) hours as needed for wheezing or shortness of breath.  . budesonide-formoterol (SYMBICORT) 160-4.5 MCG/ACT inhaler INHALE 2 PUFFS FIRST THING IN THE MORNING AND ANOTHER 2 PUFFS ABOUT 12 HOURS LATER.  . ferrous sulfate 325 (65 FE) MG tablet Take 325 mg by mouth daily with  breakfast.  . Mometasone Furoate (ASMANEX HFA) 200 MCG/ACT AERO Inhale 2 puffs into the lungs 2 (two) times daily.  . montelukast (SINGULAIR) 10 MG tablet Take 1 tablet (10 mg total) by mouth at bedtime.  . Multiple Vitamin (MULTIVITAMIN) capsule Take 1 capsule by mouth daily.    . rosuvastatin (CRESTOR) 10 MG tablet Take 10 mg by mouth daily.    . valsartan-hydrochlorothiazide (DIOVAN-HCT) 80-12.5 MG per tablet Take 1 tablet by mouth daily.   . [DISCONTINUED] albuterol (VENTOLIN HFA) 108 (90 Base) MCG/ACT inhaler Inhale 2 puffs into the lungs every 4 (four) hours as needed for wheezing or shortness of breath.  . [DISCONTINUED] Mometasone Furoate (ASMANEX HFA) 200 MCG/ACT AERO Inhale 2 puffs into the lungs 2 (two) times daily.  . beclomethasone (QVAR REDIHALER) 80 MCG/ACT inhaler Inhale 2 puffs into the lungs 2 (two) times daily. (Patient not taking: Reported on 03/09/2019)  . budesonide-formoterol (SYMBICORT) 160-4.5 MCG/ACT inhaler Inhale 2 puffs into the lungs 2 (two) times a day.  . fluticasone (FLONASE) 50 MCG/ACT nasal spray Place 1 spray into both nostrils daily.  . Mometasone Furoate (ASMANEX HFA) 200 MCG/ACT AERO Inhale 2 puffs into the lungs daily.   No facility-administered encounter medications on file as of 03/09/2019.      Review of Systems  Review of Systems  Constitutional: Negative for activity change, fatigue and fever.  HENT: Negative for sinus pressure and sore throat.   Respiratory: Negative for cough, shortness of breath and wheezing.   Cardiovascular: Negative for chest pain and palpitations.  Gastrointestinal: Negative for diarrhea, nausea and vomiting.  Musculoskeletal: Negative for arthralgias.  Neurological: Negative for dizziness.  Psychiatric/Behavioral: Negative for sleep disturbance. The patient is not nervous/anxious.      Physical Exam  BP 124/70 (BP Location: Left Arm, Cuff Size: Normal)   Pulse 65   Temp 98.3 F (36.8 C) (Oral)   Ht 5\' 1"  (1.549 m)    Wt 157 lb (71.2 kg)   SpO2 98%   BMI 29.66 kg/m   Wt Readings from Last 5 Encounters:  03/09/19 157 lb (71.2 kg)  08/02/18 154 lb 12.8 oz (70.2 kg)  05/09/17 155 lb 12.8 oz (70.7 kg)  04/19/16 155 lb 12.8 oz (70.7 kg)  04/18/15 160 lb (72.6 kg)    Physical Exam Vitals signs and nursing note reviewed.  Constitutional:      General: She is not in acute distress.    Appearance: Normal appearance.  HENT:     Head: Normocephalic and atraumatic.     Right Ear: Tympanic membrane, ear canal and external ear normal. There is no impacted cerumen.     Left Ear: Tympanic membrane, ear canal and external ear normal. There is no impacted cerumen.     Nose: Nose normal. No congestion.     Mouth/Throat:     Mouth: Mucous membranes are moist.  Pharynx: Oropharynx is clear.  Eyes:     Pupils: Pupils are equal, round, and reactive to light.  Neck:     Musculoskeletal: Normal range of motion.  Cardiovascular:     Rate and Rhythm: Normal rate and regular rhythm.     Pulses: Normal pulses.     Heart sounds: Normal heart sounds. No murmur.  Pulmonary:     Effort: Pulmonary effort is normal. No respiratory distress.     Breath sounds: Normal breath sounds. No decreased air movement. No decreased breath sounds, wheezing or rales.  Abdominal:     General: Abdomen is flat. Bowel sounds are normal.     Palpations: Abdomen is soft.  Skin:    General: Skin is warm and dry.     Capillary Refill: Capillary refill takes less than 2 seconds.  Neurological:     General: No focal deficit present.     Mental Status: She is alert and oriented to person, place, and time. Mental status is at baseline.     Gait: Gait normal.  Psychiatric:        Mood and Affect: Mood normal.        Behavior: Behavior normal.        Thought Content: Thought content normal.        Judgment: Judgment normal.      Lab Results:  CBC No results found for: WBC, RBC, HGB, HCT, PLT, MCV, MCH, MCHC, RDW, LYMPHSABS,  MONOABS, EOSABS, BASOSABS  BMET No results found for: NA, K, CL, CO2, GLUCOSE, BUN, CREATININE, CALCIUM, GFRNONAA, GFRAA  BNP No results found for: BNP  ProBNP No results found for: PROBNP    Assessment & Plan:   Asthma, severe persistent, well-controlled Assessment: Pulmonary function test today stable Patient maintained well on Symbicort 160 and Asmanex Breath sounds clear to auscultation today  Plan: Continue Symbicort 160 and Asmanex 220 as prescribed [facility] with [facility] of [facility]ers Continue Singulair Start daily antihistamine during change of seasons to help with flaring Can use Flonase 1 spray each nostril for nasal congestion allergy-like symptoms Follow up in 6 months     Return in about 6 months (around 09/09/2019), or if symptoms worsen or fail to improve, for Follow up with Dr. Melvyn Novas.   Lauraine Rinne, NP 03/09/2019   This appointment was 28 minutes long with over 50% of the time in direct face-to-face patient care, assessment, plan of care, and follow-up.

## 2019-03-09 ENCOUNTER — Encounter: Payer: Self-pay | Admitting: Pulmonary Disease

## 2019-03-09 ENCOUNTER — Ambulatory Visit: Payer: Medicare HMO | Admitting: Internal Medicine

## 2019-03-09 ENCOUNTER — Other Ambulatory Visit: Payer: Self-pay

## 2019-03-09 ENCOUNTER — Ambulatory Visit (INDEPENDENT_AMBULATORY_CARE_PROVIDER_SITE_OTHER): Payer: Medicare Other | Admitting: Internal Medicine

## 2019-03-09 ENCOUNTER — Ambulatory Visit: Payer: Medicare Other | Admitting: Pulmonary Disease

## 2019-03-09 VITALS — BP 124/70 | HR 65 | Temp 98.3°F | Ht 61.0 in | Wt 157.0 lb

## 2019-03-09 DIAGNOSIS — J4550 Severe persistent asthma, uncomplicated: Secondary | ICD-10-CM

## 2019-03-09 LAB — PULMONARY FUNCTION TEST
DL/VA % pred: 90 %
DL/VA: 3.84 ml/min/mmHg/L
DLCO unc % pred: 81 %
DLCO unc: 13.51 ml/min/mmHg
FEF 25-75 Post: 0.46 L/sec
FEF 25-75 Pre: 0.39 L/sec
FEF2575-%Change-Post: 17 %
FEF2575-%Pred-Post: 36 %
FEF2575-%Pred-Pre: 30 %
FEV1-%Change-Post: 1 %
FEV1-%Pred-Post: 68 %
FEV1-%Pred-Pre: 67 %
FEV1-Post: 0.95 L
FEV1-Pre: 0.94 L
FEV1FVC-%Change-Post: -8 %
FEV1FVC-%Pred-Pre: 62 %
FEV6-%Change-Post: 7 %
FEV6-%Pred-Post: 118 %
FEV6-%Pred-Pre: 109 %
FEV6-Post: 2.02 L
FEV6-Pre: 1.88 L
FEV6FVC-%Change-Post: -3 %
FEV6FVC-%Pred-Post: 98 %
FEV6FVC-%Pred-Pre: 101 %
FVC-%Change-Post: 11 %
FVC-%Pred-Post: 120 %
FVC-%Pred-Pre: 107 %
FVC-Post: 2.16 L
FVC-Pre: 1.94 L
Post FEV1/FVC ratio: 44 %
Post FEV6/FVC ratio: 93 %
Pre FEV1/FVC ratio: 48 %
Pre FEV6/FVC Ratio: 97 %
RV % pred: 126 %
RV: 2.61 L
TLC % pred: 114 %
TLC: 5.12 L

## 2019-03-09 MED ORDER — ASMANEX HFA 200 MCG/ACT IN AERO: 2.0000 | INHALATION_SPRAY | Freq: Two times a day (BID) | RESPIRATORY_TRACT | 5 refills | Status: DC

## 2019-03-09 MED ORDER — FLUTICASONE PROPIONATE 50 MCG/ACT NA SUSP: 1.0000 | Freq: Every day | NASAL | 3 refills | Status: DC

## 2019-03-09 MED ORDER — ALBUTEROL SULFATE HFA 108 (90 BASE) MCG/ACT IN AERS: 2.0000 | INHALATION_SPRAY | RESPIRATORY_TRACT | 5 refills | Status: DC | PRN

## 2019-03-09 MED ORDER — BUDESONIDE-FORMOTEROL FUMARATE 160-4.5 MCG/ACT IN AERO: 2.0000 | INHALATION_SPRAY | Freq: Two times a day (BID) | RESPIRATORY_TRACT | 0 refills | Status: DC

## 2019-03-09 MED ORDER — ASMANEX HFA 200 MCG/ACT IN AERO: 2.0000 | INHALATION_SPRAY | Freq: Every day | RESPIRATORY_TRACT | 0 refills | Status: DC

## 2019-03-09 NOTE — Patient Instructions (Addendum)
Continue Symbicort 160 >>> 2 puffs in the morning right when you wake up, rinse out your mouth after use, 12 hours later 2 puffs, rinse after use >>> Take this daily, no matter what >>> This is not a rescue inhaler   Continue Asthmanex 200 mcg as prescribed  Only use your albuterol as a rescue medication to be used if you can't catch your breath by resting or doing a relaxed purse lip breathing pattern.  - The less you use it, the better it will work when you need it. - Ok to use up to 2 puffs  every 4 hours if you must but call for immediate appointment if use goes up over your usual need - Don't leave home without it !!  (think of it like the spare tire for your car)   Continue Singulair   Please start taking a daily antihistamine:  >>>choose one of: zyrtec, claritin, allegra, or xyzal  >>>these are over the counter medications  >>>can choose generic option  >>>take daily  >>>this medication helps with allergies, post nasal drip, and cough   Can use flonase 1 spray each nostril as needed for nasal congestion or allergy-like symptoms   [facility] to help with cost of inhalers    GERD management: >>>Avoid laying flat until 2 hours after meals >>>Elevate head of the bed including entire chest >>>Reduce size of meals and amount of fat, acid, spices, caffeine and sweets >>>If you are smoking, Please stop! >>>Decrease alcohol consumption >>>Work on maintaining a healthy weight with normal BMI       Return in about 6 months (around 09/09/2019), or if symptoms worsen or fail to improve, for Follow up with Dr. Melvyn Novas.      Coronavirus (COVID-19) Are you at risk?  Are you at risk for the Coronavirus (COVID-19)?  To be considered HIGH RISK for Coronavirus (COVID-19), you have to meet the following criteria:  . Traveled to [place], [place], [place], [place] or [place]; or in the [place] to [place], [place], [place], or [place]; and have  fever, cough, and shortness of breath within the last 2 weeks of travel OR . Been in close contact with a person diagnosed with COVID-19 within the last 2 weeks and have fever, cough, and shortness of breath . IF YOU DO NOT MEET THESE CRITERIA, YOU ARE CONSIDERED LOW RISK FOR COVID-19.  What to do if you are HIGH RISK for COVID-19?  Marland Kitchen If you are having a medical emergency, call 911. . Seek medical care right away. Before you go to a doctor's office, urgent care or emergency department, call ahead and tell them about your recent travel, contact with someone diagnosed with COVID-19, and your symptoms. You should receive instructions from your physician's office regarding next steps of care.  . When you arrive at healthcare provider, tell the healthcare staff immediately you have returned from visiting [place], [place], [place], [place] or [place]; or traveled in the [place] to [place], [place], [place], or [place]; in the last two weeks or you have been in close contact with a person diagnosed with COVID-19 in the last 2 weeks.   . Tell the health care staff about your symptoms: fever, cough and shortness of breath. . After you have been seen by a medical provider, you will be either: o Tested for (COVID-19) and discharged home on quarantine except to seek medical care if symptoms worsen, and asked to  -  Stay home and avoid contact with others until you get your results (4-5 days)  - Avoid travel on public transportation if possible (such as bus, train, or airplane) or o Sent to the Emergency Department by EMS for evaluation, COVID-19 testing, and possible admission depending on your condition and test results.  What to do if you are LOW RISK for COVID-19?  Reduce your risk of any infection by using the same precautions used for avoiding the common cold or flu:  Marland Kitchen Wash your hands often with soap and warm water for at least 20 seconds.  If soap and water are not readily available, use an  alcohol-based hand sanitizer with at least 60% alcohol.  . If coughing or sneezing, cover your mouth and nose by coughing or sneezing into the elbow areas of your shirt or coat, into a tissue or into your sleeve (not your hands). . Avoid shaking hands with others and consider head nods or verbal greetings only. . Avoid touching your eyes, nose, or mouth with unwashed hands.  . Avoid close contact with people who are sick. . Avoid places or events with large numbers of people in one location, like concerts or sporting events. . Carefully consider travel plans you have or are making. . If you are planning any travel outside or inside the [place], visit the CDC's Travelers' Health webpage for the latest health notices. . If you have some symptoms but not all symptoms, continue to monitor at home and seek medical attention if your symptoms worsen. . If you are having a medical emergency, call 911.   [place] / e-Visit: [email or web address]         [facility]: [facility]: [phone]                   [facility]: [phone]           It is flu season:   >>> Best ways to protect herself from the flu: Receive the yearly flu vaccine, practice good hand hygiene washing with soap and also using hand sanitizer when available, eat a nutritious meals, get adequate rest, hydrate appropriately   Please contact the office if your symptoms worsen or you have concerns that you are not improving.   Thank you for choosing [facility] for your healthcare, and for allowing [place] to partner with you on your healthcare journey. I am thankful to be able to provide care to you today.   [name]-C

## 2019-03-09 NOTE — Progress Notes (Signed)
PFT done today. 

## 2019-03-09 NOTE — Assessment & Plan Note (Addendum)
Assessment: Pulmonary function test today stable Patient maintained well on Symbicort 160 and Asmanex Breath sounds clear to auscultation today  Plan: Continue Symbicort 160 and Asmanex 220 as prescribed [facility] with [facility] of [facility]ers Continue Singulair Start daily antihistamine during change of seasons to help with flaring Can use Flonase 1 spray each nostril for nasal congestion allergy-like symptoms Follow up in 6 months

## 2019-03-09 NOTE — Patient Outreach (Signed)
[facility]) Care Management  03/09/2019  Suzanne Harvey Dec 09, 1945 [id number]   Telephone Screen  Referral Date: 03/09/2019 Referral Source: MD Office Referral Reason: "Asthma, med mgmt-cost of inhalers" Insurance: [facility] Medicare   Outreach attempt #1 to patient. Spoke with patient and screening completed.   Social: Patient states she resides in her home alone. She voices that she is independent with ADLs/IADLs. She denies any recent falls and doesn't use assistive devices. She states that she drives herself to MD appts.   Conditions: Patient reported history of asthma and GERD. She voices that she has been dealing with Asthma for several years. She states she knows how to managecondition. She reports that she usually has flare up when the weather changes. She denies needing any further education or support at this time.   Medications: Per patient report, she is currently taking about 6 meds. She reports that her respiratory meds are costly and she has trouble affording them. Patient states that she is taking Symbicort but has trouble affording it. She voices that she also is supposed to be taking Quvar and Asmanex but due to difficulty afford them she has no t been taking them as advised. She denies any issues being able to manage her meds at this time.    Appointments: Patient followed by PCP and sees regularly. She also sess pulmonologist and saw them today.     Consent: [facility] services reviewed and discussed with patient. Verbal consent for services given. Patient voices only needing pharmacy assistance at this time.   Plan: RN CM will send [facility] pharmacy referral for possible med assistance.   Enzo Montgomery, RN,BSN,CCM [place] Management Telephonic Care Management Coordinator Direct Phone: [phone] Toll Free: [phone] Fax: [phone]

## 2019-03-12 ENCOUNTER — Other Ambulatory Visit: Payer: Self-pay | Admitting: Pharmacist

## 2019-03-12 NOTE — Patient Outreach (Signed)
[place] [facility]) Care Management  [place]   03/12/2019  Suzanne L [place] 11-Apr-1946 [id number]  Reason for referral: Medication Assistance with inhalers  Referral source: Pulmonary office Current insurance: [facility]  PMHx includes but not limited to:  Asthma, GERD  Outreach:  Unsuccessful telephone call attempt #1 to patient. HIPAA compliant voicemail left requesting a return call  Plan:  -I will mail patient an unsuccessful outreach letter.  -I will make another outreach attempt to patient within 3-4 business days.    Ralene Bathe, PharmD, [place] [phone]

## 2019-03-13 ENCOUNTER — Other Ambulatory Visit: Payer: Self-pay | Admitting: Pharmacist

## 2019-03-13 NOTE — Patient Outreach (Addendum)
[facility]) Care Management  [place]   03/13/2019  Suzanne Harvey [birth year] [id number]  Reason for referral: Medication Assistance with inhalers  Referral source: pulmonary office Current insurance: [facility]  PMHx includes but not limited to:  Asthma, GERD, HTN, HLD  Outreach:  Incoming call and voicemail from patient.  Return call to patient today.   Successful telephone call with [name].  HIPAA identifiers verified.   Subjective:  Patient reports she takes Symbicort scheduled and a steroid inhaler PRN when she can afford it.  She also reports having a rescue albuterol inhaler.  [facility] and Symbicort co-pay currently is $141 for 90 day supply.   Objective: The ASCVD Risk score Mikey Bussing DC Jr., et al., 2013) failed to calculate for the following reasons:   Cannot find a previous HDL lab   Cannot find a previous total cholesterol lab  No results found for: CREATININE  No results found for: HGBA1C  Lipid Panel  No results found for: CHOL, TRIG, HDL, CHOLHDL, VLDL, LDLCALC, LDLDIRECT  BP Readings from Last 3 Encounters:  03/09/19 124/70  08/02/18 110/60  05/09/17 138/72    Allergies  Allergen Reactions  . Penicillins   . Sulfonamide Derivatives     Medications Reviewed Today    Reviewed by Lauraine Rinne, NP (Nurse Practitioner) on 03/09/19 at 1031  Med List Status: <None>  Medication Order Taking? Sig Documenting Provider Last Dose Status Informant  albuterol (VENTOLIN HFA) 108 (90 Base) MCG/ACT inhaler [id number] Yes Inhale 2 puffs into the lungs every 4 (four) hours as needed for wheezing or shortness of breath. [name], MD Taking Active   beclomethasone (QVAR REDIHALER) 80 MCG/ACT inhaler [id number] No Inhale 2 puffs into the lungs 2 (two) times daily.  Patient not taking: Reported on 03/09/2019   [name], MD Not Taking Active   budesonide-formoterol [facility]) 160-4.5 MCG/ACT inhaler  [id number] Yes INHALE 2 PUFFS FIRST THING IN THE MORNING AND ANOTHER 2 PUFFS ABOUT 12 HOURS LATER. [name], MD Taking Active   ferrous sulfate 325 (65 FE) MG tablet [id number] Yes Take 325 mg by mouth daily with breakfast. [provider] Taking Active   Mometasone Furoate [facility] HFA) 200 MCG/ACT AERO [id number] Yes Inhale 2 puffs into the lungs 2 (two) times daily. [name], MD Taking Active   montelukast (SINGULAIR) 10 MG tablet [id number] Yes Take 1 tablet (10 mg total) by mouth at bedtime. [name], MD Taking Active   Multiple Vitamin (MULTIVITAMIN) capsule [id number] Yes Take 1 capsule by mouth daily.   [provider] Taking Active   rosuvastatin (CRESTOR) 10 MG tablet [id number] Yes Take 10 mg by mouth daily.   [provider] Taking Active   valsartan-hydrochlorothiazide (DIOVAN-HCT) 80-12.5 MG per tablet [id number] Yes Take 1 tablet by mouth daily.  [provider] Taking Active           Assessment: Drugs sorted by system:  Hematologic: ferrous sulfate  Cardiovascular: rosuvastatin, valsartan-HCTZ  Pulmonary/Allergy: albuterol inhaler, budesonide-formoterol inhaler, fluticasone NS, mometasone inhaler, montelukast  Vitamins/Minerals/Supplements: MVI   Medication Assistance Findings:  Medication assistance needs identified: inhalers  Extra Help:  Not eligible for Extra Help Low Income Subsidy based on reported income and assets  Patient Assistance Programs: Due to income, patient will only qualify to apply for Merck patient assistance program (< 400% FPL).  Merck has equivalent substitution for Symbicort --> Dulera as well as Proventil +  Asmanex inhalers.   Dulera, Proventil, Asmanex made by [employer] o Income requirement met: Yes o Out-of-pocket prescription expenditure met:   Not Applicable - Reviewed program requirements with patient.    Plan: . Will route note to pulmonary office re: possible substitution  Ralene Bathe,  PharmD, [place] [phone]   Addendum: Patient called back to let me know she just opened up her "bag" from pulmonary office visit and found 2 boxes of Symbicort and 2 boxes of Asmanex inhalers.  She wanted to make sure I was aware of what products she has at home.    Ralene Bathe, PharmD, [place] [phone]

## 2019-03-14 ENCOUNTER — Other Ambulatory Visit: Payer: Self-pay | Admitting: Pharmacy Technician

## 2019-03-14 ENCOUNTER — Telehealth: Payer: Self-pay | Admitting: Pulmonary Disease

## 2019-03-14 ENCOUNTER — Other Ambulatory Visit: Payer: Self-pay | Admitting: Pharmacist

## 2019-03-14 NOTE — Telephone Encounter (Signed)
03/14/2019 1133  Jen, [employer], [place],  Please see the information listed below.  Patient was [facility] to [facility].  Pharmacy out reach was performed by [facility].  We are coordinating getting the patient switch to [facility] 200 as well as starting applications for Proventil and Asmanex.  This can help her with her overall cost of her medications.  Can we keep her eyes out for the inner office female that should be coming with these forms.  I will also route to Lauren the nurse who traditionally works with me to keep an eye out for this information.  [name]

## 2019-03-14 NOTE — Telephone Encounter (Signed)
-----   Message from Rudean Haskell, [facility] sent at 03/14/2019  8:32 AM EDT ----- Doristine Devoid - thanks! We'll mail out apps today.  ----- Message ----- From: Lauraine Rinne, NP Sent: 03/13/2019  11:25 PM EDT To: Rudean Haskell, [place]  Works for me. Whatever to help the patient.   Aaron Edelman  ----- Message ----- From: Rudean Haskell, [facility] Sent: 03/13/2019   2:10 PM EDT To: Lauraine Rinne, NP  Salley Scarlet Aaron Edelman, I was able to speak with Ms. Martinique today.  She is only eligible for [employer] program due to income.  Could substitute Symbicort --> Dulera, also apply for Proventil + Asmanax.  What are your thoughts?  Thanks, [employer]

## 2019-03-14 NOTE — Patient Outreach (Signed)
[place] [facility]) Care Management  03/14/2019  Suzanne Harvey 12/16/1945 [id number]                          Medication Assistance Referral  Referral From: [place]  Medication/Company: [place] and Proventil HFA / Merck Patient application portion:  Education officer, museum portion: [employer] to B. Mack   Follow up:  Will follow up with patient in 7-10 business days to confirm application(s) have been received.  Maud Deed Chana Bode [place] Certified Pharmacy Technician [place] Management Direct Dial:[phone]

## 2019-03-14 NOTE — Patient Outreach (Signed)
[place] [facility]) Care Management  [place] 03/14/2019  Suzanne Harvey [birth year] [id number]  Pulmonary office approved inhaler substitution Symbicort --> Dulera in order to apply for Merck patient assistance program.  Will also include Asmanex and Proventil in application.   Plan: I will route patient assistance letter to [place] technician who will coordinate patient assistance program application process for medications listed above.  [facility] pharmacy technician will assist with obtaining all required documents from both patient and provider(s) and submit application(s) once completed.    Ralene Bathe, PharmD, [place] [phone]

## 2019-03-15 ENCOUNTER — Ambulatory Visit: Payer: Medicare Other | Admitting: Pharmacist

## 2019-03-16 ENCOUNTER — Ambulatory Visit: Payer: Medicare Other | Admitting: Pharmacist

## 2019-03-19 NOTE — Telephone Encounter (Signed)
03/19/2019 0051  [facility] patient assistance paperwork received today.  Signed and filled out.  Will be sent back on inter office mail.  We will route to [employer], [employer] as FYI.   [name]

## 2019-03-23 ENCOUNTER — Other Ambulatory Visit: Payer: Self-pay | Admitting: Pharmacy Technician

## 2019-03-23 NOTE — Patient Outreach (Signed)
[facility]) Care Management  03/23/2019  Suzanne Harvey 1946-01-27 [id number]    Successful call placed to patient regarding patient assistance application(s) for Dulera, Proventil HFA and Asmanex , HIPAA identifiers verified. [name] states that she received [employer]ck application in the mail yesterday. [name] states that she will try to have it in the mail in the next couple of days.  Follow up:  Will follow up with patient in 2-3 weeks if document has not been received.  Maud Deed Chana Bode [place] Certified Pharmacy Technician [place] Management Direct Dial:[phone]

## 2019-03-26 NOTE — Progress Notes (Signed)
Chart and office note reviewed in detail  > agree with a/p as outlined    

## 2019-04-06 ENCOUNTER — Other Ambulatory Visit: Payer: Self-pay | Admitting: Pharmacy Technician

## 2019-04-06 NOTE — Patient Outreach (Signed)
[place] [facility]) Care Management  04/06/2019  Suzanne Harvey 13-Aug-1946 [id number]   Received patient portion(s) of patient assistance application for Asmanex, Dulera and Proventil HFA. Prepared to mail completed application and required documents into [employer]ck.  Will follow up with company in 10-14 business days to check status of application.  Maud Deed Chana Bode [place] Certified Pharmacy Technician [place] Management Direct Dial:[phone]

## 2019-04-09 ENCOUNTER — Other Ambulatory Visit: Payer: Self-pay | Admitting: Internal Medicine

## 2019-04-23 ENCOUNTER — Other Ambulatory Visit: Payer: Self-pay | Admitting: Pharmacy Technician

## 2019-04-23 NOTE — Patient Outreach (Signed)
[place] [facility]) Care Management  04/23/2019  Suzanne Harvey 1946-03-28 [id number]    Follow up call placed to [employer]ck regarding patient assistance application(s) for Asmanex, Dulera and Proventil HFA , Murray Hodgkins confirms application has been recieved. Attestation form mailed out to patient on 8/26.   Successful call placed to patient regarding patient assistance receipt of attestation form from [employer]ck for [employer], Dulera and Proventil HFA, HIPAA identifiers verified. [name] states that form has not come in the mail yet. Requested that she contact me when she has received it so that I may assist her with filling it out. She states that she will.  Follow up:  Will follow up with patient in 3-5 business days if call has not been returned.  Maud Deed Chana Bode [place] Certified Pharmacy Technician [place] Management Direct Dial:[phone]

## 2019-04-27 ENCOUNTER — Other Ambulatory Visit: Payer: Self-pay | Admitting: Pharmacy Technician

## 2019-04-27 NOTE — Patient Outreach (Signed)
[facility]) Care Management  04/27/2019  Suzanne Harvey [birth year] [id number]    Successful call placed to patient regarding patient assistance receipt of attestation form from Merck for Asmanex, Dulera and Proventil HFA, HIPAA identifiers verified. [name] states that she received attestation form in the mail today. Assisted patient with filling out form and patient states she will take form to post office to mail out today.  Follow up:  Will follow up with [employer] in 7-10 business days to check application status  Maud Deed. Chana Bode [place] Certified Pharmacy Technician [place] Management Direct [phone]

## 2019-05-03 ENCOUNTER — Other Ambulatory Visit: Payer: Self-pay | Admitting: Pharmacy Technician

## 2019-05-03 NOTE — Patient Outreach (Signed)
[place]) Care Management  05/03/2019  Suzanne Harvey 02-18-1946 [id number]    Follow up call placed to [employer]ck regarding patient assistance application(s) for [facility], Proventil HFA, Asmanex , Wille Glaser confirms patient has been approved as of 9/10 until 08/23/19. Medications to arrive at patients home in 10-14 business days.  Follow up:  Will follow up with patient 14-21 business days to confirm medication has been delivered.  Maud Deed Chana Bode [place] Certified Pharmacy Technician [place] Management Direct Dial:[phone]

## 2019-05-28 ENCOUNTER — Telehealth: Payer: Self-pay | Admitting: Pulmonary Disease

## 2019-05-28 ENCOUNTER — Other Ambulatory Visit: Payer: Self-pay | Admitting: Pharmacy Technician

## 2019-05-28 MED ORDER — ALBUTEROL SULFATE HFA 108 (90 BASE) MCG/ACT IN AERS: 2.0000 | INHALATION_SPRAY | Freq: Four times a day (QID) | RESPIRATORY_TRACT | 1 refills | Status: DC | PRN

## 2019-05-28 MED ORDER — MOMETASONE FURO-FORMOTEROL FUM 200-5 MCG/ACT IN AERO: 2.0000 | INHALATION_SPRAY | Freq: Two times a day (BID) | RESPIRATORY_TRACT | 1 refills | Status: DC

## 2019-05-28 MED ORDER — ASMANEX HFA 200 MCG/ACT IN AERO: 2.0000 | INHALATION_SPRAY | Freq: Two times a day (BID) | RESPIRATORY_TRACT | 1 refills | Status: DC

## 2019-05-28 NOTE — Telephone Encounter (Signed)
-----   Message from Adaline Sill, CPhT sent at 05/28/2019 11:42 AM EDT ----- Regarding: Script issues for patient assistance Good Morning!  Patient has been approved for Asmanex, Dulera and Proventil HFA, unfortunately company was able to read strength on form for Asmanex so they voided it. With that void means the scripts for [facility] and Proventil were voided as well because they were on the same form.... Will you please fax 3 month scripts for these 3 meds to [facility] @ [phone]?  Thank you so much!!  Maud Deed. Chana Bode [place] Certified Pharmacy Technician [place] Management Direct Dial:[phone]

## 2019-05-28 NOTE — Patient Outreach (Addendum)
[place] [facility]) Care Management  05/28/2019  Suzanne Harvey [birth year] [id number]    Successful call placed to patient regarding patient assistance medication delivery of Asmanex, Dulera and Proventil HFA , HIPAA identifiers verified. [name] states that her medications have not arrived from [employer]ck patient assistance.   Follow up call placed to [facility] [employer] ) Pharmacy regarding patient assistance shipping details for Asmanex, Dulera and Proventil HFA, Brie states that they were unable to read the Asmanex HFA strength on the Rx form submitted. She states they faxed provides office multiple times and did not get clarification therefore the script was voided. When asked about Dulera and Proventil HFA, she states that because all 3 scripts were written on same form they were all voided. New scripts will need to be sent into [facility].  Follow up:  Will send in-basket message to B. Warner Mccreedy, FNP-C to inform.   Contacted patient back and informed of issue.  Maud Deed Chana Bode [place] Certified Pharmacy Technician [place] Management Direct [phone]

## 2019-05-28 NOTE — Telephone Encounter (Signed)
Sure. Suzanne Harvey have been printed. Will have them signed and faxed back to [facility].

## 2019-05-28 NOTE — Telephone Encounter (Signed)
05/28/2019 1153  C,  Can you see the information listed below.  Can we please get these prescriptions placed.  Printed.  And faxed to [facility] at the number listed below.  Wyn Quaker, FNP

## 2019-06-01 ENCOUNTER — Telehealth: Payer: Self-pay | Admitting: Pulmonary Disease

## 2019-06-01 NOTE — Telephone Encounter (Signed)
Received a fax from [facility] (pharmacy that will be dispensing the medications for patient). They wanted to make sure that it would be ok for the patient to be on both Dulera and Asmanex at the same time. They called this "not typical therapy".   Aaron Edelman, please advise. Thanks!

## 2019-06-03 NOTE — Telephone Encounter (Signed)
Yes that is okay.  That is the outlined regimen by Dr. Melvyn Novas.  Will route to Dr. Melvyn Novas as Juluis Rainier.  [name]

## 2019-06-11 ENCOUNTER — Other Ambulatory Visit: Payer: Self-pay | Admitting: Pulmonary Disease

## 2019-06-11 DIAGNOSIS — J4550 Severe persistent asthma, uncomplicated: Secondary | ICD-10-CM

## 2019-06-26 ENCOUNTER — Other Ambulatory Visit: Payer: Self-pay | Admitting: Pharmacy Technician

## 2019-06-26 ENCOUNTER — Other Ambulatory Visit: Payer: Self-pay | Admitting: Pharmacist

## 2019-06-26 NOTE — Patient Outreach (Signed)
[place] [facility]) Care Management  [place]   06/26/2019  Suzanne Harvey 13-Jun-1946 [id number]  Patient has been approved for Asmanex, Dulera, and Proventil patient assistance program(s).  Patient has been instructed on how to order refills and renewal process for 2021.  Plan: -Will close [facility] pharmacy case as goals of care have been met.  -Thank you for allowing [facility] pharmacy to be involved in this patient's care.    Ralene Bathe, PharmD, [place] [phone]

## 2019-06-26 NOTE — Patient Outreach (Signed)
[place] [facility]) Care Management  06/26/2019  Suzanne Harvey Apr 05, 1946 [id number]    Successful call placed to patient regarding patient assistance medication delivery of Asmanex, Dulera and Proventil HFA, HIPAA identifiers verified. [name] confirms that she received a 90 day supply of all 3 medications. She verifies that she has no additional questions at this time.  Follow up:  Will route note to [place] for case closure  Maud Deed. Chana Bode [place] Certified Pharmacy Technician [place] Management Direct Dial:[phone]

## 2019-07-06 ENCOUNTER — Other Ambulatory Visit: Payer: Self-pay | Admitting: Pharmacy Technician

## 2019-07-06 NOTE — Patient Outreach (Signed)
[place] [facility]) Care Management  07/06/2019  Scarlette L Martinique [birth year] [id number]                                       Medication Assistance Referral  Referral From: Incoming call from patient stating she received re-enrollment application from [place]. Infromed her I would mail a return envelope for her to mail it back to me in.  Medication/Company: Asmanex, Proventil HFA and Dulera / Merck  Patient application portion:  N/A Patient already has application to mail back into me Provider application portion: Interoffice Mailed to B. Warner Mccreedy, NP Provider address/fax verified via: Office website  Follow up:  Will follow up with patient in 7-10 business days to confirm application(s) have been received.  Maud Deed Chana Bode [place] Certified Pharmacy Technician [place] Management Direct Dial:[phone]

## 2019-07-09 ENCOUNTER — Other Ambulatory Visit: Payer: Self-pay | Admitting: Pharmacist

## 2019-07-09 NOTE — Patient Outreach (Signed)
[place] [facility]) Care Management  [name] 07/09/2019  Suzanne Harvey 1946/05/26 [id number]  Patient assisted with patient assistance program application process for Dulera, Proventil, and Asmanex for 2020.  Patient calling [place] for assistance in 2021 as well.  Pharmacy technician has mailed out [id number] applications to patient and provider.  Will open new [place] encounter and assist as needed with process.   Ralene Bathe, PharmD, [place] [phone]

## 2019-07-16 ENCOUNTER — Other Ambulatory Visit: Payer: Self-pay | Admitting: Pharmacy Technician

## 2019-07-16 NOTE — Patient Outreach (Signed)
[place] [facility]) Care Management  07/16/2019  Jamirra L Martinique 1945-10-18 [id number]   Received patient portion(s) of patient assistance application(s) for Asmanex, Dulera and Proventil HFA. Prepared to mail completed application and required documents into [employer].  Will mail application out on [id number] for re-enrollment for 2021   Maud Deed. Chana Bode [place] Certified Pharmacy Technician [place] Management Direct Dial:[phone]

## 2019-07-18 DIAGNOSIS — R7303 Prediabetes: Secondary | ICD-10-CM | POA: Diagnosis not present

## 2019-07-18 DIAGNOSIS — I10 Essential (primary) hypertension: Secondary | ICD-10-CM | POA: Diagnosis not present

## 2019-07-18 DIAGNOSIS — D649 Anemia, unspecified: Secondary | ICD-10-CM | POA: Diagnosis not present

## 2019-07-18 DIAGNOSIS — J4520 Mild intermittent asthma, uncomplicated: Secondary | ICD-10-CM | POA: Diagnosis not present

## 2019-08-02 ENCOUNTER — Other Ambulatory Visit: Payer: Self-pay | Admitting: Family Medicine

## 2019-08-02 DIAGNOSIS — Z1231 Encounter for screening mammogram for malignant neoplasm of breast: Secondary | ICD-10-CM

## 2019-08-06 DIAGNOSIS — H40013 Open angle with borderline findings, low risk, bilateral: Secondary | ICD-10-CM | POA: Diagnosis not present

## 2019-08-09 ENCOUNTER — Other Ambulatory Visit: Payer: Self-pay | Admitting: Pharmacy Technician

## 2019-08-09 NOTE — Patient Outreach (Signed)
[place] [facility]) Care Management  08/09/2019  Suzanne Harvey 24-Nov-1945 [id number]    Incoming call from patient regarding patient assistance receipt of attestation form from Merck for [employer] and Proventil HFA, HIPAA identifiers verified. Assisted [name] with completing attestation from.  Follow up:  Will follow up with [employer]ck in 10-14 business days to check status of application  [employer]. Chana Bode [place] Certified Pharmacy Technician [place] Management Direct Dial:[phone]

## 2019-08-20 ENCOUNTER — Other Ambulatory Visit: Payer: Self-pay

## 2019-08-20 MED ORDER — BUDESONIDE-FORMOTEROL FUMARATE 160-4.5 MCG/ACT IN AERO: 2.0000 | INHALATION_SPRAY | Freq: Two times a day (BID) | RESPIRATORY_TRACT | 6 refills | Status: DC

## 2019-09-10 ENCOUNTER — Ambulatory Visit: Payer: Medicare Other | Admitting: Internal Medicine

## 2019-09-20 ENCOUNTER — Other Ambulatory Visit: Payer: Self-pay | Admitting: Pharmacy Technician

## 2019-09-20 ENCOUNTER — Other Ambulatory Visit: Payer: Self-pay | Admitting: Pharmacist

## 2019-09-20 NOTE — Patient Outreach (Signed)
[facility]) Care Management  09/20/2019  Suzanne Harvey [birth year] [id number]    Follow up call placed to [employer]ck regarding patient assistance application(s) for Dulera, Asmanex and Proventil HFA , [place] confirms patient has been approved as of 1/25 until 08/22/2020.     Follow up call placed to patient regarding patient assistance application(s) for Dulera, Asmanex and Proventil HFA , HIPAA identifiers verified. Informed [name] of her approval with program. Provider her with [facility] contact number to request her refills when they are needed. Requested she contact me if she has any issues obtaining refills.  Follow up:  Will route note to [place] for case closure  Maud Deed. Chana Bode [place] Certified Pharmacy Technician [place] Management Direct Dial:[phone]

## 2019-09-20 NOTE — Patient Outreach (Signed)
[place] [facility]) Care Management  [place]   09/20/2019  Ricci L Martinique 1946-08-21 [id number]  Patient has been approved for Dulera, Proventil, and Asmanex patient assistance program(s).  Patient has been instructed on how to order refills and renewal process for 2021. / 2022  Plan: -Will close [place] case as goals of care have been met.  -I have provided my contact information if patient or family needs to reach out to me in the future.  -Thank you for allowing [facility] pharmacy to be involved in this patient's care.    Ralene Bathe, PharmD, [place] [phone]

## 2019-09-24 ENCOUNTER — Other Ambulatory Visit: Payer: Self-pay

## 2019-09-24 ENCOUNTER — Ambulatory Visit
Admission: RE | Admit: 2019-09-24 | Discharge: 2019-09-24 | Disposition: A | Discharge status: Home / Self Care | Payer: Medicare Other | Source: Ambulatory Visit | Attending: Family Medicine | Admitting: Family Medicine

## 2019-09-24 DIAGNOSIS — Z1231 Encounter for screening mammogram for malignant neoplasm of breast: Secondary | ICD-10-CM | POA: Diagnosis not present

## 2019-10-10 ENCOUNTER — Other Ambulatory Visit: Payer: Self-pay | Admitting: Internal Medicine

## 2019-10-10 MED ORDER — MONTELUKAST SODIUM 10 MG PO TABS: 10.0000 mg | ORAL_TABLET | Freq: Every day | ORAL | 0 refills | Status: DC

## 2019-10-21 ENCOUNTER — Ambulatory Visit: Payer: Medicare Other | Attending: Internal Medicine

## 2019-10-21 DIAGNOSIS — Z23 Encounter for immunization: Secondary | ICD-10-CM | POA: Insufficient documentation

## 2019-10-21 NOTE — Progress Notes (Signed)
   Covid-19 Vaccination Clinic  Name:  Suzanne Harvey    MRN: [id number] DOB: Oct 19, 1945  10/21/2019  [name] was observed post Covid-19 immunization for 30 minutes based on pre-vaccination screening without incidence. She was provided with Vaccine Information Sheet and instruction to access the V-Safe system.   [name] was instructed to call 911 with any severe reactions post vaccine: Marland Kitchen Difficulty breathing  . Swelling of your face and throat  . A fast heartbeat  . A bad rash all over your body  . Dizziness and weakness    Immunizations Administered    Name Date Dose VIS Date Route   Pfizer COVID-19 Vaccine 10/21/2019  9:34 AM 0.3 mL 08/03/2019 Intramuscular   Manufacturer: [place]   Lot: [id number]   [place]: [id number]

## 2019-11-20 ENCOUNTER — Ambulatory Visit: Payer: Medicare Other | Attending: Internal Medicine

## 2019-11-20 DIAGNOSIS — Z23 Encounter for immunization: Secondary | ICD-10-CM

## 2019-11-20 NOTE — Progress Notes (Signed)
   Covid-19 Vaccination Clinic  Name:  Suzanne Harvey    MRN: [id number] DOB: Jun 02, 1946  11/20/2019  Suzanne Harvey was observed post Covid-19 immunization for 15 minutes without incident. She was provided with Vaccine Information Sheet and instruction to access the V-Safe system.   Suzanne Harvey was instructed to call 911 with any severe reactions post vaccine: Marland Kitchen Difficulty breathing  . Swelling of face and throat  . A fast heartbeat  . A bad rash all over body  . Dizziness and weakness   Immunizations Administered    Name Date Dose VIS Date Route   Pfizer COVID-19 Vaccine 11/20/2019  8:30 AM 0.3 mL 08/03/2019 Intramuscular   Manufacturer: [place]   Lot: [id number]   [place]: [id number]

## 2019-11-30 DIAGNOSIS — R7303 Prediabetes: Secondary | ICD-10-CM | POA: Diagnosis not present

## 2019-11-30 DIAGNOSIS — R799 Abnormal finding of blood chemistry, unspecified: Secondary | ICD-10-CM | POA: Diagnosis not present

## 2019-11-30 DIAGNOSIS — D510 Vitamin B12 deficiency anemia due to intrinsic factor deficiency: Secondary | ICD-10-CM | POA: Diagnosis not present

## 2019-11-30 DIAGNOSIS — J4520 Mild intermittent asthma, uncomplicated: Secondary | ICD-10-CM | POA: Diagnosis not present

## 2019-11-30 DIAGNOSIS — I10 Essential (primary) hypertension: Secondary | ICD-10-CM | POA: Diagnosis not present

## 2019-12-03 DIAGNOSIS — E785 Hyperlipidemia, unspecified: Secondary | ICD-10-CM | POA: Diagnosis not present

## 2019-12-03 DIAGNOSIS — I10 Essential (primary) hypertension: Secondary | ICD-10-CM | POA: Diagnosis not present

## 2020-01-03 ENCOUNTER — Other Ambulatory Visit: Payer: Self-pay | Admitting: Internal Medicine

## 2020-01-07 ENCOUNTER — Other Ambulatory Visit: Payer: Self-pay

## 2020-01-07 ENCOUNTER — Ambulatory Visit: Payer: Medicare Other | Admitting: Adult Health

## 2020-01-07 ENCOUNTER — Encounter: Payer: Self-pay | Admitting: Adult Health

## 2020-01-07 DIAGNOSIS — J309 Allergic rhinitis, unspecified: Secondary | ICD-10-CM | POA: Diagnosis not present

## 2020-01-07 DIAGNOSIS — J4550 Severe persistent asthma, uncomplicated: Secondary | ICD-10-CM

## 2020-01-07 MED ORDER — MONTELUKAST SODIUM 10 MG PO TABS: 10.0000 mg | ORAL_TABLET | Freq: Every day | ORAL | 3 refills | Status: DC

## 2020-01-07 NOTE — Assessment & Plan Note (Signed)
Continue on current regimen .   

## 2020-01-07 NOTE — Patient Instructions (Signed)
Keep up good work .  Continue on current regimen  Rinse after inhaler use.  Activity as tolerated.  Follow up with Dr. Melvyn Novas  In 1 year and As needed

## 2020-01-07 NOTE — Progress Notes (Signed)
@Patient  ID: Suzanne Harvey, female    DOB: Dec 23, 1945, 74 y.o.   MRN: [id number]  Chief Complaint  Patient presents with  . Follow-up    Asthma     Referring provider: Lucianne Lei, MD  HPI: 74 year old female never smoker followed for severe chronic asthma  TEST/EVENTS :  /13/2020- SARS-CoV-2-negative  04/03/2014- pulmonary function test- FVC 2.13 (109% predicted), postbronchodilator ratio 42, postbronchodilator FEV1 0.91 (60% predicted), no bronchodilator response, DLCO 90  03/09/2019-pulmonary function test-FVC 1.94 (107% predicted), postbronchodilator ratio 44, postbronchodilator FEV1 0.95 (68% predicted), no bronchodilator response, slight mid flow reversibility, DLCO 81  01/07/2020 Follow up : Severe chronic Asthma  Patient returns for a follow-up visit.  Was seen last July 2020.  She has underlying severe chronic asthma.  She remains on Dulera (she was changed from Symbicort and is able to get [facility] through patient assistance)  . Remains on dual ICS to help control asthma symptoms, takes Asmanex Twice daily  . On Singulair daily  Doing well with no flare of cough or wheezing . Activity level is good. No increased albuterol use. Covid vaccines utd.  No oral steroid use.     Allergies  Allergen Reactions  . Penicillins   . Sulfonamide Derivatives     Immunization History  Administered Date(s) Administered  . Influenza, High Dose Seasonal PF 07/29/2018  . Influenza-Unspecified 05/23/2014  . PFIZER SARS-COV-2 Vaccination 10/21/2019, 11/20/2019    Past Medical History:  Diagnosis Date  . Asthma   . GERD (gastroesophageal reflux disease)     Tobacco History: Social History   Tobacco Use  Smoking Status Never Smoker  Smokeless Tobacco Never Used   Counseling given: Not Answered   Outpatient Medications Prior to Visit  Medication Sig Dispense Refill  . albuterol (VENTOLIN HFA) 108 (90 Base) MCG/ACT inhaler Inhale 2 puffs into the lungs every 4 (four) hours  as needed for wheezing or shortness of breath. 18 g 5  . ferrous sulfate 325 (65 FE) MG tablet Take 325 mg by mouth daily with breakfast.    . Mometasone Furoate (ASMANEX HFA) 200 MCG/ACT AERO Inhale 2 puffs into the lungs 2 (two) times daily. 39 g 1  . mometasone-formoterol (DULERA) 200-5 MCG/ACT AERO Inhale 2 puffs into the lungs 2 (two) times daily. 39 g 1  . montelukast (SINGULAIR) 10 MG tablet Take 1 tablet (10 mg total) by mouth at bedtime. 90 tablet 0  . Multiple Vitamin (MULTIVITAMIN) capsule Take 1 capsule by mouth daily.      . rosuvastatin (CRESTOR) 10 MG tablet Take 10 mg by mouth daily.      . valsartan-hydrochlorothiazide (DIOVAN-HCT) 80-12.5 MG per tablet Take 1 tablet by mouth daily.     . budesonide-formoterol (SYMBICORT) 160-4.5 MCG/ACT inhaler Inhale 2 puffs into the lungs 2 (two) times daily. 1 Inhaler 6  . fluticasone (FLONASE) 50 MCG/ACT nasal spray PLACE 1 SPRAY INTO BOTH NOSTRILS DAILY. (Patient not taking: Reported on 01/07/2020) 48 mL 3  . albuterol (VENTOLIN HFA) 108 (90 Base) MCG/ACT inhaler Inhale 2 puffs into the lungs every 6 (six) hours as needed for wheezing or shortness of breath. 18 g 1   No facility-administered medications prior to visit.     Review of Systems:   Constitutional:   No  weight loss, night sweats,  Fevers, chills, fatigue, or  lassitude.  HEENT:   No headaches,  Difficulty swallowing,  Tooth/dental problems, or  Sore throat,  No sneezing, itching, ear ache, nasal congestion, post nasal drip,   CV:  No chest pain,  Orthopnea, PND, swelling in lower extremities, anasarca, dizziness, palpitations, syncope.   GI  No heartburn, indigestion, abdominal pain, nausea, vomiting, diarrhea, change in bowel habits, loss of appetite, bloody stools.   Resp: No shortness of breath with exertion or at rest.  No excess mucus, no productive cough,  No non-productive cough,  No coughing up of blood.  No change in color of mucus.  No wheezing.  No  chest wall deformity  Skin: no rash or lesions.  GU: no dysuria, change in color of urine, no urgency or frequency.  No flank pain, no hematuria   MS:  No joint pain or swelling.  No decreased range of motion.  No back pain.    Physical Exam  BP 122/68 (BP Location: Left Arm, Cuff Size: Normal)   Pulse 64   Ht 5\' 3"  (1.6 m)   Wt 154 lb (69.9 kg)   SpO2 99%   BMI 27.28 kg/m   GEN: A/Ox3; pleasant , NAD, well nourished    HEENT:  [place]/AT,   NOSE-clear, THROAT-clear, no lesions, no postnasal drip or exudate noted.   NECK:  Supple w/ fair ROM; no JVD; normal carotid impulses w/o bruits; no thyromegaly or nodules palpated; no lymphadenopathy.    RESP  Clear  P & A; w/o, wheezes/ rales/ or rhonchi. no accessory muscle use, no dullness to percussion  CARD:  RRR, no m/r/g, no peripheral edema, pulses intact, no cyanosis or clubbing.  GI:   Soft & nt; nml bowel sounds; no organomegaly or masses detected.   Musco: Warm bil, no deformities or joint swelling noted.   Neuro: alert, no focal deficits noted.    Skin: Warm, no lesions or rashes    Lab Results:  CBC No results found for: WBC, RBC, HGB, HCT, PLT, MCV, MCH, MCHC, RDW, LYMPHSABS, MONOABS, EOSABS, BASOSABS  BMET No results found for: NA, K, CL, CO2, GLUCOSE, BUN, CREATININE, CALCIUM, GFRNONAA, GFRAA  BNP No results found for: BNP  ProBNP No results found for: PROBNP  Imaging: No results found.    PFT Results Latest Ref Rng & Units 03/09/2019 04/03/2014  FVC-Pre L 1.94 -  FVC-Predicted Pre % 107 109  FVC-Post L 2.16 2.16  FVC-Predicted Post % 120 110  Pre FEV1/FVC % % 48 43  Post FEV1/FCV % % 44 42  FEV1-Pre L 0.94 0.92  FEV1-Predicted Pre % 67 61  FEV1-Post L 0.95 0.91  DLCO UNC% % 81 90  DLCO COR %Predicted % 90 99  TLC L 5.12 4.59  TLC % Predicted % 114 103  RV % Predicted % 126 108    Lab Results  Component Value Date   NITRICOXIDE 15 04/19/2016        Assessment & Plan:   Asthma,  severe persistent, well-controlled Excellent control on present regimen.  Patient is on dual ICS inhalers patient education on oral care.  Along with chronic steroid use. Continue on present regimen. Plan  Patient Instructions  Keep up good work .  Continue on current regimen  Rinse after inhaler use.  Activity as tolerated.  Follow up with Dr. Melvyn Novas  In 1 year and As needed       Allergic rhinitis Continue on current regimen     Rexene Edison, NP 01/07/2020

## 2020-01-07 NOTE — Assessment & Plan Note (Signed)
Excellent control on present regimen.  Patient is on dual ICS inhalers patient education on oral care.  Along with chronic steroid use. Continue on present regimen. Plan  Patient Instructions  Keep up good work .  Continue on current regimen  Rinse after inhaler use.  Activity as tolerated.  Follow up with Dr. Melvyn Novas  In 1 year and As needed

## 2020-01-07 NOTE — Addendum Note (Signed)
Addended by: Desmond Dike C on: 01/07/2020 12:15 PM   Modules accepted: Orders

## 2020-02-20 DIAGNOSIS — J4520 Mild intermittent asthma, uncomplicated: Secondary | ICD-10-CM | POA: Diagnosis not present

## 2020-02-20 DIAGNOSIS — M159 Polyosteoarthritis, unspecified: Secondary | ICD-10-CM | POA: Diagnosis not present

## 2020-02-20 DIAGNOSIS — I10 Essential (primary) hypertension: Secondary | ICD-10-CM | POA: Diagnosis not present

## 2020-02-20 DIAGNOSIS — E785 Hyperlipidemia, unspecified: Secondary | ICD-10-CM | POA: Diagnosis not present

## 2020-03-10 ENCOUNTER — Telehealth: Payer: Self-pay | Admitting: Pulmonary Disease

## 2020-03-10 NOTE — Telephone Encounter (Signed)
[facility] Pharmacist.  LM to call back 03/11/20.

## 2020-03-10 NOTE — Telephone Encounter (Signed)
Called and spoke with Pharmacist, at [facility]. Patient assistance paperwork received was faxed, with Brian,NP. She has received paperwork for patient assistance for 2 [employer]  meds, Symbicort 160 being one, but she was unsure what the second med was. Patient inhalers listed are-Dulera 200, Albuterol, and Asmanex 200. Per previous encounters- Patient was switched from Symbicort to [facility], because she could receive assistance.  Patient assistance program for Ruthe Mannan is no longer available.   Pharmacist is wanting to clarify inhalers, before faxing form back to Brian,NP. There is no encounters for recent assistance, no forms in scan.  LOV 01/07/20-with TP 01/07/2020 Follow up : Severe chronic Asthma  Patient returns for a follow-up visit.  Was seen last July 2020.  She has underlying severe chronic asthma.  She remains on Dulera (she was changed from Symbicort and is able to get [facility] through patient assistance)  . Remains on dual ICS to help control asthma symptoms, takes Asmanex Twice daily  . On Singulair daily  Doing well with no flare of cough or wheezing . Activity level is good. No increased albuterol use. Covid vaccines utd.  No oral steroid use.    Message routed to [facility] for assistance

## 2020-03-10 NOTE — Telephone Encounter (Signed)
Patient previously managed on Dulera 200 and Asmanex 200.  Unfortunately Dulera 200 is no longer available through the pharmaceutical patient assistance program.  Patient is now switching from [facility] 200 to Symbicort 160.  Patient is to remain on Asmanex 200.  She remains on dual ICS therapy based off of evaluation on 01/07/2020.  The patient assistance program application should be easy for me to help the patient get qualified for Symbicort 160.I hope this helps.This was previously managed with an [facility] but since the patient has a primary care at Dr. Fransico Setters office it is my understanding that [facility] will be helping the patient with filing this paperwork.Wyn Quaker, FNP

## 2020-03-13 NOTE — Telephone Encounter (Signed)
Called and spoke with Dr. Dianah Field about pt who stated she has faxed the AZ&ME application to our office for either [facility] or Dr. Melvyn Novas to fill out for pt's meds. Will leave encounter open while we keep an eye out for the application that was sent.

## 2020-03-18 NOTE — Telephone Encounter (Signed)
I have not received any paperwork on the pt. LMTCB x1 for [facility].

## 2020-03-18 NOTE — Telephone Encounter (Signed)
Suzanne Harvey, please advise if you have received AZ&ME application from Dr. Fransico Setters office.

## 2020-03-18 NOTE — Telephone Encounter (Signed)
I do not have this paperwork.  I have never received it.  I would recommend they follow-up with [facility].  The last I have heard was working with [facility].  You can also check with TP NP as she was the one who last saw the patient.Unfortunately this is a complicated process given the fact that since patient is established with Dr. Fransico Setters office we have to use [facility] instead of what we had previously used which [facility].  To help coordinate the paperwork much more efficiently.Aaron Edelman

## 2020-03-19 NOTE — Telephone Encounter (Signed)
Spoke with Dr. Dianah Field, who is a pharmacist who works with Dr. Fransico Setters office. She states that our office needs to be the ones to fill out the patient assistance paperwork for Symbicort since we are managing the pt's inhalers. This paperwork was faxed twice to [place] last week. Advised Dr. Dianah Field that we have been informed that we do not have any patient assistance paperwork for this pt. She is going to refax all forms to [place] tomorrow as she is not working in Dr. Fransico Setters clinic today. Will await fax.

## 2020-03-21 DIAGNOSIS — J4520 Mild intermittent asthma, uncomplicated: Secondary | ICD-10-CM | POA: Diagnosis not present

## 2020-03-21 DIAGNOSIS — M159 Polyosteoarthritis, unspecified: Secondary | ICD-10-CM | POA: Diagnosis not present

## 2020-03-21 DIAGNOSIS — I10 Essential (primary) hypertension: Secondary | ICD-10-CM | POA: Diagnosis not present

## 2020-03-21 DIAGNOSIS — E785 Hyperlipidemia, unspecified: Secondary | ICD-10-CM | POA: Diagnosis not present

## 2020-03-24 NOTE — Telephone Encounter (Signed)
Checked TP's look at, I can't locate this document.

## 2020-04-11 DIAGNOSIS — M159 Polyosteoarthritis, unspecified: Secondary | ICD-10-CM | POA: Diagnosis not present

## 2020-04-11 DIAGNOSIS — E785 Hyperlipidemia, unspecified: Secondary | ICD-10-CM | POA: Diagnosis not present

## 2020-04-11 DIAGNOSIS — I10 Essential (primary) hypertension: Secondary | ICD-10-CM | POA: Diagnosis not present

## 2020-04-11 DIAGNOSIS — J4520 Mild intermittent asthma, uncomplicated: Secondary | ICD-10-CM | POA: Diagnosis not present

## 2020-04-14 DIAGNOSIS — Z0000 Encounter for general adult medical examination without abnormal findings: Secondary | ICD-10-CM | POA: Diagnosis not present

## 2020-04-14 DIAGNOSIS — N189 Chronic kidney disease, unspecified: Secondary | ICD-10-CM | POA: Diagnosis not present

## 2020-04-14 DIAGNOSIS — R7303 Prediabetes: Secondary | ICD-10-CM | POA: Diagnosis not present

## 2020-04-14 DIAGNOSIS — I10 Essential (primary) hypertension: Secondary | ICD-10-CM | POA: Diagnosis not present

## 2020-04-22 DIAGNOSIS — M159 Polyosteoarthritis, unspecified: Secondary | ICD-10-CM | POA: Diagnosis not present

## 2020-04-22 DIAGNOSIS — J4530 Mild persistent asthma, uncomplicated: Secondary | ICD-10-CM | POA: Diagnosis not present

## 2020-04-22 DIAGNOSIS — E785 Hyperlipidemia, unspecified: Secondary | ICD-10-CM | POA: Diagnosis not present

## 2020-04-22 DIAGNOSIS — I10 Essential (primary) hypertension: Secondary | ICD-10-CM | POA: Diagnosis not present

## 2020-05-16 DIAGNOSIS — Z23 Encounter for immunization: Secondary | ICD-10-CM | POA: Diagnosis not present

## 2020-05-22 DIAGNOSIS — I10 Essential (primary) hypertension: Secondary | ICD-10-CM | POA: Diagnosis not present

## 2020-05-22 DIAGNOSIS — M159 Polyosteoarthritis, unspecified: Secondary | ICD-10-CM | POA: Diagnosis not present

## 2020-05-22 DIAGNOSIS — E785 Hyperlipidemia, unspecified: Secondary | ICD-10-CM | POA: Diagnosis not present

## 2020-06-02 ENCOUNTER — Telehealth: Payer: Self-pay | Admitting: Internal Medicine

## 2020-06-02 NOTE — Telephone Encounter (Signed)
Called patient to clarify which two medications patient should be taking. According to last note per [name] should be on asmanex and symbicort, see previous phone message.  Patient voiced understanding Nothing further needed at this time.

## 2020-06-03 MED ORDER — BECLOMETHASONE DIPROPIONATE 80 MCG/ACT IN AERS: 1.0000 | INHALATION_SPRAY | Freq: Two times a day (BID) | RESPIRATORY_TRACT | 6 refills | Status: DC

## 2020-06-03 NOTE — Telephone Encounter (Signed)
Patient is returning phone call. Patient phone number is [phone].

## 2020-06-03 NOTE — Telephone Encounter (Signed)
Fine with me  To try qvar 80 one twice daily (the double strength means one inhaler will last 60 days) and set her up with our new pharmacist when she's seeing patients which should be before the qvar runs out if it's too expensive)

## 2020-06-03 NOTE — Telephone Encounter (Signed)
Called and spoke with patient, advised her of Dr. Gustavus Bryant response regarding QVar.  Verified pharmacy and script sent.  There is no schedule for the new pharmacist at this time.  Recall request put in for pharmacy appointment.  She requested to get patient assistance as it was over $100 the last time she was on it.  Form on Dr. Gustavus Bryant desk for signature.  Patient will come by on 06/04/2020 to fill out her portion and bring needed documents.

## 2020-06-03 NOTE — Telephone Encounter (Addendum)
Spoke with the pt  She states can not get her asmanex anymore bc it's too expensive  She is already taking symbicort  Wants to know if we can prescribe her Qvar again  If so would like help with pt assistance for this  Pt has had her covid vaccines Please advise, thanks   Here are her meds and allergies:  Current Outpatient Medications on File Prior to Visit  Medication Sig Dispense Refill  . albuterol (VENTOLIN HFA) 108 (90 Base) MCG/ACT inhaler Inhale 2 puffs into the lungs every 4 (four) hours as needed for wheezing or shortness of breath. 18 g 5  . budesonide-formoterol (SYMBICORT) 160-4.5 MCG/ACT inhaler Inhale 2 puffs into the lungs in the morning and at bedtime. 1 each 6  . ferrous sulfate 325 (65 FE) MG tablet Take 325 mg by mouth daily with breakfast.    . fluticasone (FLONASE) 50 MCG/ACT nasal spray PLACE 1 SPRAY INTO BOTH NOSTRILS DAILY. (Patient not taking: Reported on 01/07/2020) 48 mL 3  . Mometasone Furoate (ASMANEX HFA) 200 MCG/ACT AERO Inhale 2 puffs into the lungs 2 (two) times daily. 39 g 1  . montelukast (SINGULAIR) 10 MG tablet Take 1 tablet (10 mg total) by mouth at bedtime. 90 tablet 3  . Multiple Vitamin (MULTIVITAMIN) capsule Take 1 capsule by mouth daily.      . rosuvastatin (CRESTOR) 10 MG tablet Take 10 mg by mouth daily.      . valsartan-hydrochlorothiazide (DIOVAN-HCT) 80-12.5 MG per tablet Take 1 tablet by mouth daily.      No current facility-administered medications on file prior to visit.   Allergies  Allergen Reactions  . Penicillins   . Sulfonamide Derivatives

## 2020-06-03 NOTE — Telephone Encounter (Signed)
LMTCB x 1 

## 2020-06-05 NOTE — Telephone Encounter (Signed)
Suzanne Paganini, do you know if pt came by to complete this?

## 2020-06-10 NOTE — Telephone Encounter (Signed)
LMTCB x1 for pt.  

## 2020-06-10 NOTE — Telephone Encounter (Signed)
I have not received any pt assistance forms on this pt. I went through all of Dr Gustavus Bryant papers this morning.

## 2020-06-12 NOTE — Telephone Encounter (Signed)
lmtcb for pt.  Due to two unsuccessful attempts to reach pt, message will be closed. Will sign off per triage protocol.

## 2020-06-17 ENCOUNTER — Telehealth: Payer: Self-pay | Admitting: Internal Medicine

## 2020-06-20 NOTE — Telephone Encounter (Signed)
Forms have been faxed by [place], [place] on 06/20/20.

## 2020-06-20 NOTE — Telephone Encounter (Signed)
Forms are in Dr Gustavus Bryant lookat stack in A pod

## 2020-06-20 NOTE — Telephone Encounter (Signed)
done

## 2020-06-21 DIAGNOSIS — M159 Polyosteoarthritis, unspecified: Secondary | ICD-10-CM | POA: Diagnosis not present

## 2020-06-21 DIAGNOSIS — I10 Essential (primary) hypertension: Secondary | ICD-10-CM | POA: Diagnosis not present

## 2020-06-21 DIAGNOSIS — E785 Hyperlipidemia, unspecified: Secondary | ICD-10-CM | POA: Diagnosis not present

## 2020-07-22 ENCOUNTER — Telehealth: Payer: Self-pay | Admitting: Internal Medicine

## 2020-07-22 DIAGNOSIS — I10 Essential (primary) hypertension: Secondary | ICD-10-CM | POA: Diagnosis not present

## 2020-07-22 DIAGNOSIS — M159 Polyosteoarthritis, unspecified: Secondary | ICD-10-CM | POA: Diagnosis not present

## 2020-07-22 DIAGNOSIS — E785 Hyperlipidemia, unspecified: Secondary | ICD-10-CM | POA: Diagnosis not present

## 2020-07-22 NOTE — Telephone Encounter (Signed)
Ms. Martinique called back and said she call her insurance company and was told on the formulary is [employer] (comperable to Qvar).  I let her know I would make Dr. Melvyn Novas aware and we would contact her back after we heard back from Dr. Melvyn Novas.  She verbalized understanding.  Dr. Melvyn Novas, Patient states she cannot afford more than $200/month for her Qvar.  She called her insurance company and was told that [place] was on their formulary.  Please advise if you want this sent to her pharmacy.  Thank you.

## 2020-07-22 NOTE — Telephone Encounter (Signed)
ATC patient.  LMTCB. 

## 2020-07-22 NOTE — Telephone Encounter (Signed)
Called and spoke with patient, she stated that she has her Symbicort covered, it will soon be time to renew her patient assistance for it.  The medication she is calling about is the Qvar, she states she was turned down for patient assistance because she has medicare part D, but her copay for the qvar is more than $200/month.  Advised patient to contact her insurance company and ask what is on their formulary that is comparable to the Qvar and we can get that list to Dr. Melvyn Novas and he can choose which in haler he feels is best for her.  She stated she was told by the insurance company that her doctor's office should know what is on the list.  I advised her that every insurance company has a different formulary and [employer] company has to let the patient know what is on their list so we can order the covered medications.  She stated she would call back again and then call us back.  Will await her return call.

## 2020-07-22 NOTE — Telephone Encounter (Signed)
arnuity added to symbicort won't be of any help so my advice is to just be real good about maint on symbicort Take 2 puffs first thing in am and then another 2 puffs about 12 hours later and then see me w/in the next month to reassess needs

## 2020-07-24 NOTE — Telephone Encounter (Signed)
Called and spoke to pt. Informed her of the recs per MW. Appt made with MW for 08/29/2020. Pt verbalized understanding and denied any further questions or concerns at this time.

## 2020-08-22 DIAGNOSIS — E785 Hyperlipidemia, unspecified: Secondary | ICD-10-CM | POA: Diagnosis not present

## 2020-08-22 DIAGNOSIS — I10 Essential (primary) hypertension: Secondary | ICD-10-CM | POA: Diagnosis not present

## 2020-08-22 DIAGNOSIS — M159 Polyosteoarthritis, unspecified: Secondary | ICD-10-CM | POA: Diagnosis not present

## 2020-08-26 DIAGNOSIS — H2513 Age-related nuclear cataract, bilateral: Secondary | ICD-10-CM | POA: Diagnosis not present

## 2020-08-26 DIAGNOSIS — H40013 Open angle with borderline findings, low risk, bilateral: Secondary | ICD-10-CM | POA: Diagnosis not present

## 2020-08-28 DIAGNOSIS — E119 Type 2 diabetes mellitus without complications: Secondary | ICD-10-CM | POA: Diagnosis not present

## 2020-08-28 DIAGNOSIS — I10 Essential (primary) hypertension: Secondary | ICD-10-CM | POA: Diagnosis not present

## 2020-08-28 DIAGNOSIS — E785 Hyperlipidemia, unspecified: Secondary | ICD-10-CM | POA: Diagnosis not present

## 2020-08-29 ENCOUNTER — Ambulatory Visit: Payer: Medicare Other | Admitting: Internal Medicine

## 2020-08-29 NOTE — Progress Notes (Deleted)
Subjective:     Patient ID: Suzanne Harvey, female   DOB: 11/28/1945   MRN: [id number]    Brief patient profile:  40  yobf never smoker  with  severe chronic asthma (since childhood)  with an irreversible component improved to FEV1 70% 09/30/2011    History of Present Illness  03/16/2013 f/u ov/Suzanne Harvey re asthma, confused with instructions/ no longer using med calendar provided Chief Complaint  Patient presents with  . Follow-up    last seen 09/2011. Pt reports she has good and bad days w/ breathing. Denies any cough, no wheezing, no chest tx  variable sob but not needing any saba, using qvar bid and symbicort 160 once a day.  rec Symbicort 160 Take 2 puffs first thing in am and then another 2 puffs about 12 hours later.  Qvar 80 2 puffs immediately after am dose of symbicort (only in am, only need the 15 min delay if short of breath or tight in am) Only use your albuterol (xopenex) as a rescue medication      05/09/2017  f/u ov/Suzanne Harvey re: severe chronic asthma  singulair maint plus symb 160 2bid/out of qvar x 3 weeks, saba just a few times a week  Chief Complaint  Patient presents with  . Follow-up    pt c/o prod cough with yellow/green mucus, sob X1 day.     acutally at bottom part of red on symbicort also now and just starting to "come down with a cold" in last 24 h prior to OV   Typically while on rx: Not limited by breathing from desired activities / avg use of saba 1-2 x weekly baseline, more since running low on meds   rec Change qvar to the redihaler = Take 2 puffs first thing in am and then another 2 puffs about 12 hours later if having any trouble with breathing or coughing or an increased need for albuterol  No change symbicort 160 Take 2 puffs first thing in am and then another 2 puffs about 12 hours later.  Call if issues with your insurance paying for your medications - we will need a list of alternatives from your insurance(formular) or the pharmacy If doing well, return yearly for  recheck office visit - otherwise we'll see you in a year     08/02/2018  f/u ov/Suzanne Harvey re: severe chronic asthma on symb 160 2bid/ singulair/qvar80 Chief Complaint  Patient presents with  . Follow-up    Breathing is doing well. She is using her proair inhaler once per wk on average.   Dyspnea:  Not limited by breathing from desired activities  = lots of walking = [place] sometime/   stationery bike ok Cough: none Sleeping: fine one pillow SABA use: rarely albuterol 02: none   rec Plan A = Automatic = symbicort 160 and asmanex 200 2 puffs of each in am and then repeat 12 hours later Work on inhaler technique:  Plan B = Backup Only use your albuterol inhaler as a rescue medication Plan C = Crisis - Prednisone 10 mg take  4 each am x 2 days,   2 each am x 2 days,  1 each am x 2 days and stop  If not able to afford the asmanex on your plan, return with your drug formulary before asmanex  runs out     08/29/2020  f/u ov/Suzanne Harvey re:  No chief complaint on file.    Dyspnea:  *** Cough: *** Sleeping: *** SABA use: ***  02: ***   No obvious day to day or daytime variability or assoc excess/ purulent sputum or mucus plugs or hemoptysis or cp or chest tightness, subjective wheeze or overt sinus or hb symptoms.   *** without nocturnal  or early am exacerbation  of respiratory  c/o's or need for noct saba. Also denies any obvious fluctuation of symptoms with weather or environmental changes or other aggravating or alleviating factors except as outlined above   No unusual exposure hx or h/o childhood pna/ asthma or knowledge of premature birth.  Current Allergies, Complete Past Medical History, Past Surgical History, Family History, and Social History were reviewed in [employer] record.  ROS  The following are not active complaints unless bolded Hoarseness, sore throat, dysphagia, dental problems, itching, sneezing,  nasal congestion or discharge of excess mucus or  purulent secretions, ear ache,   fever, chills, sweats, unintended wt loss or wt gain, classically pleuritic or exertional cp,  orthopnea pnd or arm/hand swelling  or leg swelling, presyncope, palpitations, abdominal pain, anorexia, nausea, vomiting, diarrhea  or change in bowel habits or change in bladder habits, change in stools or change in urine, dysuria, hematuria,  rash, arthralgias, visual complaints, headache, numbness, weakness or ataxia or problems with walking or coordination,  change in mood or  memory.        No outpatient medications have been marked as taking for the 08/29/20 encounter (Appointment) with [name], MD.                 Past Medical History:  GASTROESOPHAGEAL REFLUX DISEASE (ICD-530.81)  ASTHMA (ICD-493.90)  - pft's 08/11/2007 FEV1 1.15 ratio 43% with 13% improvement after bronchodilators and nl DLC0  - nl alpha1AT 05/13/2006       Objective:   Physical Exam  wt   173 September 03, 2009 >  162 04/07/2011 > 160 08/02/2011 > 161 08/06/2011 > 160 09/30/2011 > 03/16/2013  164 > 04/03/2014  159 > 04/18/2015  160 > 04/19/2016  156 > 05/09/2017   155 > 08/02/2018 154 > 08/29/2020 ***  Vital signs reviewed  08/29/2020  - Note at rest 02 sats  ***% on ***   General appearance:    ***         Assessment:

## 2020-09-02 ENCOUNTER — Other Ambulatory Visit: Payer: Self-pay | Admitting: Internal Medicine

## 2020-09-12 ENCOUNTER — Ambulatory Visit: Payer: Medicare Other | Admitting: Internal Medicine

## 2020-09-20 DIAGNOSIS — I10 Essential (primary) hypertension: Secondary | ICD-10-CM | POA: Diagnosis not present

## 2020-09-20 DIAGNOSIS — M159 Polyosteoarthritis, unspecified: Secondary | ICD-10-CM | POA: Diagnosis not present

## 2020-09-20 DIAGNOSIS — E785 Hyperlipidemia, unspecified: Secondary | ICD-10-CM | POA: Diagnosis not present

## 2020-09-22 DIAGNOSIS — H25043 Posterior subcapsular polar age-related cataract, bilateral: Secondary | ICD-10-CM | POA: Diagnosis not present

## 2020-09-22 DIAGNOSIS — H25013 Cortical age-related cataract, bilateral: Secondary | ICD-10-CM | POA: Diagnosis not present

## 2020-09-22 DIAGNOSIS — H2513 Age-related nuclear cataract, bilateral: Secondary | ICD-10-CM | POA: Diagnosis not present

## 2020-09-22 DIAGNOSIS — I10 Essential (primary) hypertension: Secondary | ICD-10-CM | POA: Diagnosis not present

## 2020-09-22 DIAGNOSIS — H2512 Age-related nuclear cataract, left eye: Secondary | ICD-10-CM | POA: Diagnosis not present

## 2020-09-26 ENCOUNTER — Other Ambulatory Visit: Payer: Self-pay

## 2020-09-26 ENCOUNTER — Encounter: Payer: Self-pay | Admitting: Internal Medicine

## 2020-09-26 ENCOUNTER — Other Ambulatory Visit: Payer: Self-pay | Admitting: Internal Medicine

## 2020-09-26 ENCOUNTER — Ambulatory Visit: Payer: Medicare Other | Admitting: Internal Medicine

## 2020-09-26 DIAGNOSIS — J4550 Severe persistent asthma, uncomplicated: Secondary | ICD-10-CM

## 2020-09-26 MED ORDER — PREDNISONE 10 MG PO TABS: ORAL_TABLET | ORAL | 5 refills | Status: DC

## 2020-09-26 NOTE — Assessment & Plan Note (Addendum)
Onset in childhood     - PFT's 09/30/2011  FEV1  1.34 (70%) ratio 44 and no change p B2, DLCO 62 corrects to 95%    - PFT's 04/03/2014  FEV1 0.91 (60%) ratio 42 and no change p B2 and DLCO 90%   - FENO 04/19/2016  =   15 on symbicort 160/ qvar 80 2bid each one > no change rx  - PFT's 03/09/2019  FEV1 0.95 (68 % ) ratio 0.44  p 1 % improvement from saba p nothing  prior to study with DLCO  13.51 (81%) corrects to 3.84 (90%)  for alv volume and FV curve typical airflow obst   - 09/26/2020  After extensive coaching inhaler device,  effectiveness =    75% (short Ti)  Overusing saba at baseline but always better on prednisone  The goal with a chronic steroid dependent illness is always arriving at the lowest effective dose that controls the disease/symptoms and not accepting a set "formula" which is based on statistics or guidelines that don't always take into account patient  variability or the natural hx of the dz in every individual patient, which may well vary over time.  For now therefore I recommend the patient maintain  Off maint pred but for flares or increase need for saba: Prednisone 10 mg take  4 each am x 2 days,   2 each am x 2 days,  1 each am x 2 days and stop    Re saba: I spent extra time with pt today reviewing appropriate use of albuterol for prn use on exertion with the following points: 1) saba is for relief of sob that does not improve by walking a slower pace or resting but rather if the pt does not improve after trying this first. 2) If the pt is convinced, as many are, that saba helps recover from activity faster then it's easy to tell if this is the case by re-challenging : ie stop, take the inhaler, then p 5 minutes try the exact same activity (intensity of workload) that just caused the symptoms and see if they are substantially diminished or not after saba 3) if there is an activity that reproducibly causes the symptoms, try the saba 15 min before the activity on alternate days    If in fact the saba really does help, then fine to continue to use it prn but advised may need to look closer at the maintenance regimen being used to achieve better control of airways disease with exertion.           Each maintenance medication was reviewed in detail including emphasizing most importantly the difference between maintenance and prns and under what circumstances the prns are to be triggered using an action plan format where appropriate.  Total time for H and P, chart review, counseling, reviewing hfa device(s) and generating customized AVS unique to this office visit / same day charting = 71min

## 2020-09-26 NOTE — Patient Instructions (Addendum)
Plan A = Automatic = Always =   Symbicort  160 Take 2 puffs first thing in am and then another 2 puffs about 12 hours later.   Work on inhaler technique:  relax and gently blow all the way out then take a nice smooth deep breath back in, triggering the inhaler at same time you start breathing in.  Hold for up to 5 seconds if you can. Blow out thru nose. Rinse and gargle with water when done - remember how golfers warm up so use an empty device to take practice inhalations   Plan B = Backup (to supplement plan A, not to replace it) Only use your albuterol inhaler as a rescue medication to be used if you can't catch your breath by resting or doing a relaxed purse lip breathing pattern.  - The less you use it, the better it will work when you need it. - Ok to use the inhaler up to 2 puffs  every 4 hours if you must but call for appointment if use goes up over your usual need - Don't leave home without it !!  (think of it like the spare tire for your car)   Ok to Try albuterol 15 min before an activity that you know would make you short of breath and see if it makes any difference and if makes none then don't take it after activity unless you can't catch your breath.      Plan C = Crisis (only if Plan B stops working well and needing more than usual)  - Prednisone 10 mg take  4 each am x 2 days,   2 each am x 2 days,  1 each am x 2 days and stop    Please schedule a follow up visit in 3 months but call sooner if needed with pfts on return

## 2020-09-26 NOTE — Progress Notes (Addendum)
Subjective:     Patient ID: Suzanne Harvey, female   DOB: [birth year]   MRN: [id number]    Brief patient profile:  49  yobf never smoker  with  severe chronic asthma (since childhood)  with an irreversible component improved to FEV1 70% 09/30/2011    History of Present Illness  03/16/2013 f/u ov/[name] re asthma, confused with instructions/ no longer using med calendar provided Chief Complaint  Patient presents with  . Follow-up    last seen 09/2011. Pt reports she has good and bad days w/ breathing. Denies any cough, no wheezing, no chest tx  variable sob but not needing any saba, using qvar bid and symbicort 160 once a day.  rec Symbicort 160 Take 2 puffs first thing in am and then another 2 puffs about 12 hours later.  Qvar 80 2 puffs immediately after am dose of symbicort (only in am, only need the 15 min delay if short of breath or tight in am) Only use your albuterol (xopenex) as a rescue medication      05/09/2017  f/u ov/[name] re: severe chronic asthma  singulair maint plus symb 160 2bid/out of qvar x 3 weeks, saba just a few times a week  Chief Complaint  Patient presents with  . Follow-up    pt c/o prod cough with yellow/green mucus, sob X1 day.     acutally at bottom part of red on symbicort also now and just starting to "come down with a cold" in last 24 h prior to OV   Typically while on rx: Not limited by breathing from desired activities / avg use of saba 1-2 x weekly baseline, more since running low on meds   rec Change qvar to the redihaler = Take 2 puffs first thing in am and then another 2 puffs about 12 hours later if having any trouble with breathing or coughing or an increased need for albuterol  No change symbicort 160 Take 2 puffs first thing in am and then another 2 puffs about 12 hours later.  Call if issues with your insurance paying for your medications - we will need a list of alternatives from your insurance(formular) or the pharmacy If doing well, return yearly for  recheck office visit - otherwise we'll see you in a year     08/02/2018  f/u ov/[name] re: severe chronic asthma on symb 160 2bid/ singulair/qvar80 Chief Complaint  Patient presents with  . Follow-up    Breathing is doing well. She is using her proair inhaler once per wk on average.   Dyspnea:  Not limited by breathing from desired activities  = lots of walking = [place] sometime/   stationery bike ok Cough: none Sleeping: fine one pillow SABA use: rarely albuterol 02: none   rec Plan A = Automatic = symbicort 160 and asmanex 200 2 puffs of each in am and then repeat 12 hours later Work on inhaler technique:  Plan B = Backup Only use your albuterol inhaler as a rescue medication Plan C = Crisis - Prednisone 10 mg take  4 each am x 2 days,   2 each am x 2 days,  1 each am x 2 days and stop  If not able to afford the asmanex on your plan, return with your drug formulary before asmanex  runs out     09/26/2020  f/u ov/[name] re:  Off qvar and asmanex   And maint just symbicort 160 / singualir x months  Chief Complaint  Patient presents with  . Follow-up    Breathing is overall doing well. She does have some increased SOB during rainy weather like today. She is using her albuterol inhaler 2-3 x per day on average.   Dyspnea:  Treadmill x 2 per week, walks 3-4 min flat very slow speed  Cough: none Sleeping: fine flat bed, one pillow SABA use: usually p exertion, never pre challenges  02: none    No obvious day to day or daytime variability or assoc excess/ purulent sputum or mucus plugs or hemoptysis or cp or chest tightness, subjective wheeze or overt sinus or hb symptoms.   Sleeping  without nocturnal  or early am exacerbation  of respiratory  c/o's or need for noct saba. Also denies any obvious fluctuation of symptoms with weather or environmental changes or other aggravating or alleviating factors except as outlined above   No unusual exposure hx or h/o childhood pna  or  knowledge of premature birth.  Current Allergies, Complete Past Medical History, Past Surgical History, Family History, and Social History were reviewed in [facility] record.  ROS  The following are not active complaints unless bolded Hoarseness, sore throat, dysphagia, dental problems, itching, sneezing,  nasal congestion or discharge of excess mucus or purulent secretions, ear ache,   fever, chills, sweats, unintended wt loss or wt gain, classically pleuritic or exertional cp,  orthopnea pnd or arm/hand swelling  or leg swelling, presyncope, palpitations, abdominal pain, anorexia, nausea, vomiting, diarrhea  or change in bowel habits or change in bladder habits, change in stools or change in urine, dysuria, hematuria,  rash, arthralgias, visual complaints, headache, numbness, weakness or ataxia or problems with walking or coordination,  change in mood or  memory.        Current Meds  Medication Sig  . albuterol (VENTOLIN HFA) 108 (90 Base) MCG/ACT inhaler Inhale 2 puffs into the lungs every 4 (four) hours as needed for wheezing or shortness of breath.  . ferrous sulfate 325 (65 FE) MG tablet Take 325 mg by mouth daily with breakfast.  . fluticasone (FLONASE) 50 MCG/ACT nasal spray PLACE 1 SPRAY INTO BOTH NOSTRILS DAILY.  . montelukast (SINGULAIR) 10 MG tablet Take 1 tablet (10 mg total) by mouth at bedtime.  . Multiple Vitamin (MULTIVITAMIN) capsule Take 1 capsule by mouth daily.  . rosuvastatin (CRESTOR) 10 MG tablet Take 10 mg by mouth daily.  . SYMBICORT 160-4.5 MCG/ACT inhaler INHALE TWO PUFFS into THE lungs TWICE DAILY  . valsartan-hydrochlorothiazide (DIOVAN-HCT) 80-12.5 MG per tablet Take 1 tablet by mouth daily.            Past Medical History:  GASTROESOPHAGEAL REFLUX DISEASE (ICD-530.81)  ASTHMA (ICD-493.90)  - pft's 08/11/2007 FEV1 1.15 ratio 43% with 13% improvement after bronchodilators and nl DLC0  - nl alpha1AT 05/13/2006       Objective:    Physical Exam  wt   173 September 03, 2009 >  162 04/07/2011 > 160 08/02/2011 > 161 08/06/2011 > 160 09/30/2011 > 03/16/2013  164 > 04/03/2014  159 > 04/18/2015  160 > 04/19/2016  156 > 05/09/2017   155 > 08/02/2018 154 > 09/26/2020  150  Vital signs reviewed  09/26/2020  - Note at rest 02 sats  97% on RA   General appearance:    amb bf nad  HEENT : pt wearing mask not removed for exam due to covid - 19 concerns.   NECK :  without JVD/Nodes/TM/ nl carotid upstrokes bilaterally  LUNGS: no acc muscle use,  Min barrel  contour chest wall with bilateral  slightly decreased bs s audible wheeze and  without cough on insp or exp maneuvers and min  Hyperresonant  to  percussion bilaterally     CV:  RRR  no s3 or murmur or increase in P2, and no edema   ABD:  soft and nontender with pos end  insp Hoover's  in the supine position. No bruits or organomegaly appreciated, bowel sounds nl  MS:   Nl gait/  ext warm without deformities, calf tenderness, cyanosis or clubbing No obvious joint restrictions   SKIN: warm and dry without lesions    NEURO:  alert, approp, nl sensorium with  no motor or cerebellar deficits apparent.              Assessment:

## 2020-10-09 DIAGNOSIS — H25812 Combined forms of age-related cataract, left eye: Secondary | ICD-10-CM | POA: Diagnosis not present

## 2020-10-09 DIAGNOSIS — H52202 Unspecified astigmatism, left eye: Secondary | ICD-10-CM | POA: Diagnosis not present

## 2020-10-09 DIAGNOSIS — H2512 Age-related nuclear cataract, left eye: Secondary | ICD-10-CM | POA: Diagnosis not present

## 2020-10-10 DIAGNOSIS — H2511 Age-related nuclear cataract, right eye: Secondary | ICD-10-CM | POA: Diagnosis not present

## 2020-10-20 DIAGNOSIS — I10 Essential (primary) hypertension: Secondary | ICD-10-CM | POA: Diagnosis not present

## 2020-10-20 DIAGNOSIS — M159 Polyosteoarthritis, unspecified: Secondary | ICD-10-CM | POA: Diagnosis not present

## 2020-10-20 DIAGNOSIS — E785 Hyperlipidemia, unspecified: Secondary | ICD-10-CM | POA: Diagnosis not present

## 2020-10-23 DIAGNOSIS — H25041 Posterior subcapsular polar age-related cataract, right eye: Secondary | ICD-10-CM | POA: Diagnosis not present

## 2020-10-23 DIAGNOSIS — H2511 Age-related nuclear cataract, right eye: Secondary | ICD-10-CM | POA: Diagnosis not present

## 2020-10-23 DIAGNOSIS — H25811 Combined forms of age-related cataract, right eye: Secondary | ICD-10-CM | POA: Diagnosis not present

## 2020-11-10 DIAGNOSIS — E785 Hyperlipidemia, unspecified: Secondary | ICD-10-CM | POA: Diagnosis not present

## 2020-11-10 DIAGNOSIS — D649 Anemia, unspecified: Secondary | ICD-10-CM | POA: Diagnosis not present

## 2020-11-10 DIAGNOSIS — J4520 Mild intermittent asthma, uncomplicated: Secondary | ICD-10-CM | POA: Diagnosis not present

## 2020-11-10 DIAGNOSIS — I110 Hypertensive heart disease with heart failure: Secondary | ICD-10-CM | POA: Diagnosis not present

## 2020-11-10 DIAGNOSIS — M159 Polyosteoarthritis, unspecified: Secondary | ICD-10-CM | POA: Diagnosis not present

## 2020-11-10 DIAGNOSIS — R7303 Prediabetes: Secondary | ICD-10-CM | POA: Diagnosis not present

## 2020-11-10 DIAGNOSIS — I129 Hypertensive chronic kidney disease with stage 1 through stage 4 chronic kidney disease, or unspecified chronic kidney disease: Secondary | ICD-10-CM | POA: Diagnosis not present

## 2020-11-10 DIAGNOSIS — N189 Chronic kidney disease, unspecified: Secondary | ICD-10-CM | POA: Diagnosis not present

## 2020-11-18 ENCOUNTER — Other Ambulatory Visit (HOSPITAL_COMMUNITY): Payer: Self-pay | Admitting: Family Medicine

## 2020-11-18 ENCOUNTER — Encounter (HOSPITAL_COMMUNITY): Payer: Medicare Other

## 2020-11-18 DIAGNOSIS — M79669 Pain in unspecified lower leg: Secondary | ICD-10-CM

## 2020-11-19 ENCOUNTER — Other Ambulatory Visit: Payer: Self-pay

## 2020-11-19 ENCOUNTER — Ambulatory Visit (HOSPITAL_COMMUNITY)
Admission: RE | Admit: 2020-11-19 | Discharge: 2020-11-19 | Disposition: A | Discharge status: Home / Self Care | Payer: Medicare Other | Source: Ambulatory Visit | Attending: Family Medicine | Admitting: Family Medicine

## 2020-11-19 DIAGNOSIS — M79669 Pain in unspecified lower leg: Secondary | ICD-10-CM | POA: Diagnosis not present

## 2020-11-19 DIAGNOSIS — M7989 Other specified soft tissue disorders: Secondary | ICD-10-CM | POA: Insufficient documentation

## 2020-11-19 NOTE — Progress Notes (Signed)
Bilateral lower extremity venous study completed.      Please see CV Proc for preliminary results.   Allysen Lazo, RVT  

## 2020-11-20 DIAGNOSIS — I10 Essential (primary) hypertension: Secondary | ICD-10-CM | POA: Diagnosis not present

## 2020-11-20 DIAGNOSIS — M159 Polyosteoarthritis, unspecified: Secondary | ICD-10-CM | POA: Diagnosis not present

## 2020-11-20 DIAGNOSIS — E785 Hyperlipidemia, unspecified: Secondary | ICD-10-CM | POA: Diagnosis not present

## 2020-12-01 DIAGNOSIS — E785 Hyperlipidemia, unspecified: Secondary | ICD-10-CM | POA: Diagnosis not present

## 2020-12-01 DIAGNOSIS — M159 Polyosteoarthritis, unspecified: Secondary | ICD-10-CM | POA: Diagnosis not present

## 2020-12-01 DIAGNOSIS — I10 Essential (primary) hypertension: Secondary | ICD-10-CM | POA: Diagnosis not present

## 2020-12-01 DIAGNOSIS — J4530 Mild persistent asthma, uncomplicated: Secondary | ICD-10-CM | POA: Diagnosis not present

## 2020-12-19 ENCOUNTER — Other Ambulatory Visit: Payer: Self-pay | Admitting: Adult Health

## 2020-12-20 DIAGNOSIS — M159 Polyosteoarthritis, unspecified: Secondary | ICD-10-CM | POA: Diagnosis not present

## 2020-12-20 DIAGNOSIS — E785 Hyperlipidemia, unspecified: Secondary | ICD-10-CM | POA: Diagnosis not present

## 2020-12-20 DIAGNOSIS — I10 Essential (primary) hypertension: Secondary | ICD-10-CM | POA: Diagnosis not present

## 2020-12-22 ENCOUNTER — Other Ambulatory Visit (HOSPITAL_COMMUNITY): Payer: Medicare Other

## 2020-12-25 ENCOUNTER — Ambulatory Visit: Payer: Medicare Other | Admitting: Internal Medicine

## 2020-12-26 ENCOUNTER — Other Ambulatory Visit (HOSPITAL_COMMUNITY)
Admission: RE | Admit: 2020-12-26 | Discharge: 2020-12-26 | Disposition: A | Discharge status: Home / Self Care | Payer: Medicare Other | Source: Ambulatory Visit | Attending: Internal Medicine | Admitting: Internal Medicine

## 2020-12-26 DIAGNOSIS — Z01812 Encounter for preprocedural laboratory examination: Secondary | ICD-10-CM | POA: Insufficient documentation

## 2020-12-26 DIAGNOSIS — Z20822 Contact with and (suspected) exposure to covid-19: Secondary | ICD-10-CM | POA: Diagnosis not present

## 2020-12-26 LAB — SARS CORONAVIRUS 2 (TAT 6-24 HRS): SARS Coronavirus 2: NEGATIVE

## 2020-12-29 ENCOUNTER — Other Ambulatory Visit: Payer: Self-pay

## 2020-12-29 ENCOUNTER — Encounter: Payer: Self-pay | Admitting: Internal Medicine

## 2020-12-29 ENCOUNTER — Ambulatory Visit: Payer: Medicare Other | Admitting: Internal Medicine

## 2020-12-29 ENCOUNTER — Ambulatory Visit (INDEPENDENT_AMBULATORY_CARE_PROVIDER_SITE_OTHER): Payer: Medicare Other | Admitting: Internal Medicine

## 2020-12-29 DIAGNOSIS — J4550 Severe persistent asthma, uncomplicated: Secondary | ICD-10-CM | POA: Diagnosis not present

## 2020-12-29 LAB — PULMONARY FUNCTION TEST
DL/VA % pred: 81 %
DL/VA: 3.4 ml/min/mmHg/L
DLCO cor % pred: 73 %
DLCO cor: 12.54 ml/min/mmHg
DLCO unc % pred: 73 %
DLCO unc: 12.54 ml/min/mmHg
FEF 25-75 Post: 0.39 L/sec
FEF 25-75 Pre: 0.37 L/sec
FEF2575-%Change-Post: 5 %
FEF2575-%Pred-Post: 31 %
FEF2575-%Pred-Pre: 29 %
FEV1-%Change-Post: 13 %
FEV1-%Pred-Post: 74 %
FEV1-%Pred-Pre: 65 %
FEV1-Post: 1.05 L
FEV1-Pre: 0.93 L
FEV1FVC-%Change-Post: 15 %
FEV1FVC-%Pred-Pre: 58 %
FEV6-%Change-Post: 0 %
FEV6-%Pred-Post: 113 %
FEV6-%Pred-Pre: 113 %
FEV6-Post: 1.99 L
FEV6-Pre: 1.98 L
FEV6FVC-%Change-Post: 2 %
FEV6FVC-%Pred-Post: 102 %
FEV6FVC-%Pred-Pre: 99 %
FVC-%Change-Post: -2 %
FVC-%Pred-Post: 111 %
FVC-%Pred-Pre: 113 %
FVC-Post: 2.05 L
FVC-Pre: 2.09 L
Post FEV1/FVC ratio: 51 %
Post FEV6/FVC ratio: 97 %
Pre FEV1/FVC ratio: 44 %
Pre FEV6/FVC Ratio: 95 %
RV % pred: 108 %
RV: 2.34 L
TLC % pred: 104 %
TLC: 4.8 L

## 2020-12-29 MED ORDER — BREZTRI AEROSPHERE 160-9-4.8 MCG/ACT IN AERO: 2.0000 | INHALATION_SPRAY | Freq: Two times a day (BID) | RESPIRATORY_TRACT | 0 refills | Status: DC

## 2020-12-29 MED ORDER — BREZTRI AEROSPHERE 160-9-4.8 MCG/ACT IN AERO: 2.0000 | INHALATION_SPRAY | Freq: Two times a day (BID) | RESPIRATORY_TRACT | 5 refills | Status: DC

## 2020-12-29 NOTE — Assessment & Plan Note (Addendum)
Onset in childhood     - PFT's 09/30/2011  FEV1  1.34 (70%) ratio 44 and no change p B2, DLCO 62 corrects to 95%    - PFT's 04/03/2014  FEV1 0.91 (60%) ratio 42 and no change p B2 and DLCO 90%   - FENO 04/19/2016  =   15 on symbicort 160/ qvar 80 2bid each one > no change rx  - PFT's 03/09/2019  FEV1 0.95 (68 % ) ratio 0.44  p 1 % improvement from saba p nothing  prior to study with DLCO  13.51 (81%) corrects to 3.84 (90%)  for alv volume and FV curve typical airflow obst  - 09/26/2020  After extensive coaching inhaler device,  effectiveness =    75% (short Ti) Plan C = Crisis (only if Plan B stops working well and needing more than usual)  - Prednisone 10 mg take  4 each am x 2 days,   2 each am x 2 days,  1 each am x 2 days and stop  - PFT's  12/29/2020  FEV1 1.05 (74 % ) ratio 0.51  p 13 % improvement from saba p albuterol x one hour prior to study with DLCO  12.54 (73%) corrects to 3.40 (80%)  for alv volume and FV curve severe concavity    Still having flares  More than twice a year requiring prednisone despite max rx  With symb/ singulair and severe residual obst pfts c/w remodeling   12/29/2020  After extensive coaching inhaler device,  effectiveness =    75% so try breztri 2bid and refer to allergy next   Re saba: I spent extra time with pt today reviewing appropriate use of albuterol for prn use on exertion with the following points: 1) saba is for relief of sob that does not improve by walking a slower pace or resting but rather if the pt does not improve after trying this first. 2) If the pt is convinced, as many are, that saba helps recover from activity faster then it's easy to tell if this is the case by re-challenging : ie stop, take the inhaler, then p 5 minutes try the exact same activity (intensity of workload) that just caused the symptoms and see if they are substantially diminished or not after saba 3) if there is an activity that reproducibly causes the symptoms, try the saba 15 min  before the activity on alternate days   If in fact the saba really does help, then fine to continue to use it prn but advised may need to look closer at the maintenance regimen being used to achieve better control of airways disease with exertion.          Each maintenance medication was reviewed in detail including emphasizing most importantly the difference between maintenance and prns and under what circumstances the prns are to be triggered using an action plan format where appropriate.  Total time for H and P, chart review, counseling, reviewing hfa device(s) and generating customized AVS unique to this office visit / same day charting = 26 min

## 2020-12-29 NOTE — Progress Notes (Signed)
PFT Done today. 

## 2020-12-29 NOTE — Patient Instructions (Addendum)
Change symbicort  160 to breztri Take 2 puffs first thing in am and then another 2 puffs about 12 hours later.   Work on inhaler technique:  relax and gently blow all the way out then take a nice smooth deep breath back in, triggering the inhaler at same time you start breathing in.  Hold for up to 5 seconds if you can. Blow out thru nose. Rinse and gargle with water when done      We will calling you to refer to Dr Bruna Potter allergy / asthma center.   Please schedule a follow up visit in 6 months but call sooner if needed

## 2020-12-29 NOTE — Progress Notes (Signed)
Subjective:     Patient ID: Teniqua L Martinique, female   DOB: [birth year]   MRN: [id number]    Brief patient profile:  49  yobf never smoker  with  severe chronic asthma (since childhood)  with an irreversible component improved to FEV1 70% 09/30/2011    History of Present Illness  03/16/2013 f/u ov/Beatrice Ziehm re asthma, confused with instructions/ no longer using med calendar provided Chief Complaint  Patient presents with  . Follow-up    last seen 09/2011. Pt reports she has good and bad days w/ breathing. Denies any cough, no wheezing, no chest tx  variable sob but not needing any saba, using qvar bid and symbicort 160 once a day.  rec Symbicort 160 Take 2 puffs first thing in am and then another 2 puffs about 12 hours later.  Qvar 80 2 puffs immediately after am dose of symbicort (only in am, only need the 15 min delay if short of breath or tight in am) Only use your albuterol (xopenex) as a rescue medication      05/09/2017  f/u ov/Eathel Pajak re: severe chronic asthma  singulair maint plus symb 160 2bid/out of qvar x 3 weeks, saba just a few times a week  Chief Complaint  Patient presents with  . Follow-up    pt c/o prod cough with yellow/green mucus, sob X1 day.     acutally at bottom part of red on symbicort also now and just starting to "come down with a cold" in last 24 h prior to OV   Typically while on rx: Not limited by breathing from desired activities / avg use of saba 1-2 x weekly baseline, more since running low on meds   rec Change qvar to the redihaler = Take 2 puffs first thing in am and then another 2 puffs about 12 hours later if having any trouble with breathing or coughing or an increased need for albuterol  No change symbicort 160 Take 2 puffs first thing in am and then another 2 puffs about 12 hours later.  Call if issues with your insurance paying for your medications - we will need a list of alternatives from your insurance(formular) or the pharmacy If doing well, return yearly for  recheck office visit - otherwise we'll see you in a year     08/02/2018  f/u ov/Winfield Caba re: severe chronic asthma on symb 160 2bid/ singulair/qvar80 Chief Complaint  Patient presents with  . Follow-up    Breathing is doing well. She is using her proair inhaler once per wk on average.   Dyspnea:  Not limited by breathing from desired activities  = lots of walking = [place] sometime/   stationery bike ok Cough: none Sleeping: fine one pillow SABA use: rarely albuterol 02: none   rec Plan A = Automatic = symbicort 160 and asmanex 200 2 puffs of each in am and then repeat 12 hours later Work on inhaler technique:  Plan B = Backup Only use your albuterol inhaler as a rescue medication Plan C = Crisis - Prednisone 10 mg take  4 each am x 2 days,   2 each am x 2 days,  1 each am x 2 days and stop  If not able to afford the asmanex on your plan, return with your drug formulary before asmanex  runs out     09/26/2020  f/u ov/Naiomy Watters re:  Off qvar and asmanex   And maint just symbicort 160 / singualir x months  Chief Complaint  Patient presents with  . Follow-up    Breathing is overall doing well. She does have some increased SOB during rainy weather like today. She is using her albuterol inhaler 2-3 x per day on average.   Dyspnea:  Treadmill x 2 per week, walks 3-4 min flat very slow speed  Cough: none Sleeping: fine flat bed, one pillow SABA use: usually p exertion, never pre challenges  02: none  rec Plan A = Automatic = Always =   Symbicort  160 Take 2 puffs first thing in am and then another 2 puffs about 12 hours later.  Work on inhaler technique:  Plan B = Backup (to supplement plan A, not to replace it) Only use your albuterol inhaler as a rescue medication Ok to Try albuterol 15 min before an activity that you know would make you short of breath   Plan C = Crisis (only if Plan B stops working well and needing more than usual)  - Prednisone 10 mg take  4 each am x 2 days,   2  each am x 2 days,  1 each am x 2 days and stop    12/29/2020  f/u ov/Pleasant Bensinger re: severe chronic asthma  Last pred 3 weeks prior on symbicort 160/singulair / freq saba Chief Complaint  Patient presents with  . Follow-up    PFT done today. She states her breathing is "average" today. She is using her albuterol inhaler 2 x per day.    Dyspnea:  Prefers  Bicycle x 25 min 3 x weekly denies ever being  Cough: none  Sleeping: flat bed/ one pillow  SABA use: avg 2 x daily  02: none Covid status:   vax x 3    No obvious day to day or daytime variability or assoc excess/ purulent sputum or mucus plugs or hemoptysis or cp or chest tightness, subjective wheeze or overt sinus or hb symptoms.   Sleeping  without nocturnal  or early am exacerbation  of respiratory  c/o's or need for noct saba. Also denies any obvious fluctuation of symptoms with weather or environmental changes or other aggravating or alleviating factors except as outlined above   No unusual exposure hx or h/o childhood pna  knowledge of premature birth.  Current Allergies, Complete Past Medical History, Past Surgical History, Family History, and Social History were reviewed in [facility] record.  ROS  The following are not active complaints unless bolded Hoarseness, sore throat, dysphagia, dental problems, itching, sneezing,  nasal congestion or discharge of excess mucus or purulent secretions, ear ache,   fever, chills, sweats, unintended wt loss or wt gain, classically pleuritic or exertional cp,  orthopnea pnd or arm/hand swelling  or leg swelling, presyncope, palpitations, abdominal pain, anorexia, nausea, vomiting, diarrhea  or change in bowel habits or change in bladder habits, change in stools or change in urine, dysuria, hematuria,  rash, arthralgias, visual complaints, headache, numbness, weakness or ataxia or problems with walking or coordination,  change in mood or  memory.        Current Meds  Medication  Sig  . albuterol (VENTOLIN HFA) 108 (90 Base) MCG/ACT inhaler Inhale 2 puffs into the lungs every 4 (four) hours as needed for wheezing or shortness of breath.  . ferrous sulfate 325 (65 FE) MG tablet Take 325 mg by mouth daily with breakfast.  . fluticasone (FLONASE) 50 MCG/ACT nasal spray PLACE 1 SPRAY INTO BOTH NOSTRILS DAILY.  . montelukast (SINGULAIR) 10 MG tablet TAKE  ONE TABLET BY MOUTH EVERYDAY AT BEDTIME  . Multiple Vitamin (MULTIVITAMIN) capsule Take 1 capsule by mouth daily.  . rosuvastatin (CRESTOR) 10 MG tablet Take 10 mg by mouth daily.  . SYMBICORT 160-4.5 MCG/ACT inhaler INHALE TWO PUFFS into THE lungs TWICE DAILY  . valsartan-hydrochlorothiazide (DIOVAN-HCT) 80-12.5 MG per tablet Take 1 tablet by mouth daily.              Past Medical History:  GASTROESOPHAGEAL REFLUX DISEASE (ICD-530.81)  ASTHMA (ICD-493.90)  - pft's 08/11/2007 FEV1 1.15 ratio 43% with 13% improvement after bronchodilators and nl DLC0  - nl alpha1AT 05/13/2006       Objective:   Physical Exam  12/29/2020     151  09/26/2020    150 08/02/2018 154 04/19/2016   156     wt   173 September 03, 2009 >  162 04/07/2011 > 160 08/02/2011 > 161 08/06/2011 > 160 09/30/2011 > 03/16/2013  164 > 04/03/2014  159 > 04/18/2015  160 >     Vital signs reviewed  12/29/2020  - Note at rest 02 sats  98% on RA   General appearance:    amb bf nad   HEENT : pt wearing mask not removed for exam due to covid - 19 concerns.   NECK :  without JVD/Nodes/TM/ nl carotid upstrokes bilaterally   LUNGS: no acc muscle use,  Min barrel  contour chest wall with bilateral  slightly decreased bs s audible wheeze and  without cough on insp or exp maneuvers and min  Hyperresonant  to  percussion bilaterally     CV:  RRR  no s3 or murmur or increase in P2, and no edema   ABD:  soft and nontender with pos end  insp Hoover's  in the supine position. No bruits or organomegaly appreciated, bowel sounds nl  MS:   Nl gait/  ext warm without  deformities, calf tenderness, cyanosis or clubbing No obvious joint restrictions   SKIN: warm and dry without lesions    NEURO:  alert, approp, nl sensorium with  no motor or cerebellar deficits apparent.            Assessment:

## 2021-01-09 ENCOUNTER — Other Ambulatory Visit: Payer: Self-pay | Admitting: Family Medicine

## 2021-01-09 DIAGNOSIS — Z1231 Encounter for screening mammogram for malignant neoplasm of breast: Secondary | ICD-10-CM

## 2021-01-12 DIAGNOSIS — J4530 Mild persistent asthma, uncomplicated: Secondary | ICD-10-CM | POA: Diagnosis not present

## 2021-01-12 DIAGNOSIS — I10 Essential (primary) hypertension: Secondary | ICD-10-CM | POA: Diagnosis not present

## 2021-01-12 DIAGNOSIS — R7303 Prediabetes: Secondary | ICD-10-CM | POA: Diagnosis not present

## 2021-01-12 DIAGNOSIS — E785 Hyperlipidemia, unspecified: Secondary | ICD-10-CM | POA: Diagnosis not present

## 2021-01-12 DIAGNOSIS — Z0001 Encounter for general adult medical examination with abnormal findings: Secondary | ICD-10-CM | POA: Diagnosis not present

## 2021-01-20 DIAGNOSIS — I10 Essential (primary) hypertension: Secondary | ICD-10-CM | POA: Diagnosis not present

## 2021-01-20 DIAGNOSIS — M159 Polyosteoarthritis, unspecified: Secondary | ICD-10-CM | POA: Diagnosis not present

## 2021-01-20 DIAGNOSIS — E785 Hyperlipidemia, unspecified: Secondary | ICD-10-CM | POA: Diagnosis not present

## 2021-01-22 ENCOUNTER — Other Ambulatory Visit: Payer: Self-pay

## 2021-01-22 ENCOUNTER — Ambulatory Visit
Admission: RE | Admit: 2021-01-22 | Discharge: 2021-01-22 | Disposition: A | Discharge status: Home / Self Care | Payer: Medicare Other | Source: Ambulatory Visit | Attending: Family Medicine | Admitting: Family Medicine

## 2021-01-22 DIAGNOSIS — Z1231 Encounter for screening mammogram for malignant neoplasm of breast: Secondary | ICD-10-CM

## 2021-02-19 DIAGNOSIS — M159 Polyosteoarthritis, unspecified: Secondary | ICD-10-CM | POA: Diagnosis not present

## 2021-02-19 DIAGNOSIS — E785 Hyperlipidemia, unspecified: Secondary | ICD-10-CM | POA: Diagnosis not present

## 2021-02-19 DIAGNOSIS — I10 Essential (primary) hypertension: Secondary | ICD-10-CM | POA: Diagnosis not present

## 2021-03-13 ENCOUNTER — Other Ambulatory Visit: Payer: Self-pay

## 2021-03-13 ENCOUNTER — Encounter: Payer: Self-pay | Admitting: Allergy

## 2021-03-13 ENCOUNTER — Ambulatory Visit: Payer: Medicare Other | Admitting: Allergy

## 2021-03-13 VITALS — BP 146/70 | HR 59 | Temp 97.5°F | Resp 18 | Ht 61.0 in | Wt 151.8 lb

## 2021-03-13 DIAGNOSIS — K9049 Malabsorption due to intolerance, not elsewhere classified: Secondary | ICD-10-CM

## 2021-03-13 DIAGNOSIS — J3089 Other allergic rhinitis: Secondary | ICD-10-CM | POA: Diagnosis not present

## 2021-03-13 DIAGNOSIS — H1013 Acute atopic conjunctivitis, bilateral: Secondary | ICD-10-CM | POA: Diagnosis not present

## 2021-03-13 DIAGNOSIS — J4550 Severe persistent asthma, uncomplicated: Secondary | ICD-10-CM | POA: Diagnosis not present

## 2021-03-13 DIAGNOSIS — T781XXD Other adverse food reactions, not elsewhere classified, subsequent encounter: Secondary | ICD-10-CM

## 2021-03-13 NOTE — Patient Instructions (Addendum)
-  continue Breztri 2 puffs twice a day -have access to albuterol inhaler 2 puffs every 4-6 hours as needed for cough/wheeze/shortness of breath/chest tightness.  May use 15-20 minutes prior to activity.   Monitor frequency of use.   -asthma add-on therapies discussed step up therapy which include injection based medications like Nucala, Fasenra, Dupixent, Tezspire to name a few.   We will obtain more labwork to determine if you may be eligible for these options.  Will plan to discuss this in more detail one we know what you qualify for  Asthma control goals:  Full participation in all desired activities (may need albuterol before activity) Albuterol use two time or less a week on average (not counting use with activity) Cough interfering with sleep two time or less a month Oral steroids no more than once a year No hospitalizations  -for allergy symptom control continue Zyrtec '10mg'$  daily as needed -continue Flonase 2 sprays each nostril daily for 1-2 weeks at a time before stopping once nasal congestion improves for maximum benefit -for itchy/watery eyes can use OTC Pataday 1 drop each eye daily as needed  -will obtain serum IgE levels for nut panel, coconut, cabbage and strawberry -I do not think you are truly allergic to these foods and most likely sensitized only but will see was lab shows - we have discussed the following in regards to foods:   Allergy: food allergy is when you have eaten a food, developed an allergic reaction after eating the food and have IgE to the food (positive food testing either by skin testing or blood testing).  Food allergy could lead to life threatening symptoms  Sensitivity: occurs when you have IgE to a food (positive food testing either by skin testing or blood testing) but is a food you eat without any issues.  This is not an allergy and we recommend keeping the food in the diet  Intolerance: this is when you have negative testing by either skin testing or  blood testing thus not allergic but the food causes symptoms (like belly pain, bloating, diarrhea etc) with ingestion.  These foods should be avoided to prevent symptoms.     Follow-up in 2-3 months or sooner if needed

## 2021-03-13 NOTE — Progress Notes (Signed)
New Patient Note  RE: Suzanne Harvey MRN: [id number] DOB: [birth year] Date of Office Visit: 03/13/2021  Primary care provider: Lucianne Lei, MD  Chief Complaint: Asthma  History of present illness: Suzanne Harvey is a 75 y.o. female presenting today for evaluation of severe persistent asthma.  She has history of asthma since childhood.  She reports symptoms of wheezing, shortness of breath, hacking cough, chest tightness.  She reports triggers include damp weather, hot weather, URIs.   She has been seeing Dr. Melvyn Novas in pulmonology for her asthma with last visit on 12/29/2020.  She has been on Symbicort, Qvar, Asmanex, Singulair for her asthma control in the past. Currently on Breztri inhaler and states she does note improved symptoms on this taking 2 puffs twice a day.  She has been on Breztri for about a month now.  Using albuterol 1-2 times a week now on Breztri.  States prior to [facility] albuterol use was daily if is not multiple times a day.  She does report having flares at least once a year requiring prednisone use.  Her last flare with prednisone was about a month ago.  Denies hospitalizations for asthma.  She states many years ago she did require O2 therapy during a flare.  She states she is allergic to pollens and foods.  She reports sneezing, itchy/watery eyes, nasal congestion/drainage worse during pollen seasons.  She takes zyrtec as needed and helps somewhat.  Will use flonase as needed and does help nasal congestion/drainage. The last allergy testing she had was many many years ago she states she was allergic to "everything except for onion".  But states the largest response was to cabbage, strawberries, nuts, coconut.  She still eats these foods in moderation. She can tolerate egg, wheat, soy, fish, shellfish, chicken, red meats, vegetables, fruits without issue.  She has lactose intolerant and reports dairy ingestion causes diarrhea.  She states the foods were tested as she was not sure if  it was contributing to her asthma symptoms or not when she was younger.  Review of systems: Review of Systems  Constitutional: Negative.   HENT:         See HPI  Eyes:        See HPI  Respiratory:         See HPI  Cardiovascular: Negative.   Gastrointestinal: Negative.   Musculoskeletal: Negative.   Skin: Negative.   Neurological: Negative.    All other systems negative unless noted above in HPI  Past medical history: Past Medical History:  Diagnosis Date   Asthma    GERD (gastroesophageal reflux disease)     Past surgical history: Past Surgical History:  Procedure Laterality Date   ABDOMINAL HYSTERECTOMY Bilateral 1977   ADENOIDECTOMY     TONSILLECTOMY      Family history:  Family History  Problem Relation Age of Onset   Cancer Mother        abd   Heart disease Father    Stroke Father    Allergic rhinitis Sister    Asthma Other        2 children    Social history: Lives in a home without carpeting with gas heating and central cooling.  No pets in the home.  There is no concern for water damage, mildew or roaches in the home.  She does not want at this time.  She denies a smoking history.   Medication List: Current Outpatient Medications  Medication Sig Dispense Refill  albuterol (VENTOLIN HFA) 108 (90 Base) MCG/ACT inhaler Inhale 2 puffs into the lungs every 4 (four) hours as needed for wheezing or shortness of breath. 18 g 5   Budeson-Glycopyrrol-Formoterol (BREZTRI AEROSPHERE) 160-9-4.8 MCG/ACT AERO Inhale 2 puffs into the lungs in the morning and at bedtime. 10.7 g 5   ferrous sulfate 325 (65 FE) MG tablet Take 325 mg by mouth daily with breakfast.     fluticasone (FLONASE) 50 MCG/ACT nasal spray PLACE 1 SPRAY INTO BOTH NOSTRILS DAILY. 48 mL 3   montelukast (SINGULAIR) 10 MG tablet TAKE ONE TABLET BY MOUTH EVERYDAY AT BEDTIME 90 tablet 2   Multiple Vitamin (MULTIVITAMIN) capsule Take 1 capsule by mouth daily.     rosuvastatin (CRESTOR) 10 MG tablet Take  10 mg by mouth daily.     valsartan-hydrochlorothiazide (DIOVAN-HCT) 80-12.5 MG per tablet Take 1 tablet by mouth daily.     No current facility-administered medications for this visit.    Known medication allergies: Allergies  Allergen Reactions   Penicillins    Sulfonamide Derivatives      Physical examination: Blood pressure (!) 146/70, pulse (!) 59, temperature (!) 97.5 F (36.4 C), temperature source Temporal, resp. rate 18, height '5\' 1"'$  (1.549 m), weight 151 lb 12.8 oz (68.9 kg), SpO2 97 %.  General: Alert, interactive, in no acute distress. HEENT: PERRLA, TMs pearly gray, turbinates non-edematous without discharge, post-pharynx non erythematous. Neck: Supple without lymphadenopathy. Lungs: Clear to auscultation without wheezing, rhonchi or rales. {no increased work of breathing. CV: Normal S1, S2 without murmurs. Abdomen: Nondistended, nontender. Skin: Warm and dry, without lesions or rashes. Extremities:  No clubbing, cyanosis or edema. Neuro:   Grossly intact.  Diagnositics/Labs:  Spirometry: FEV1: 0.71L 45%, FVC: 1.75L 86% predicted.  Status post albuterol she had a 1% improvement in FEV1 to 0.72 L or 46% which is not significant.  Allergy testing: Deferred due to reduced lung function   Assessment and plan: Severe persistent asthma  -continue Breztri 2 puffs twice a day -have access to albuterol inhaler 2 puffs every 4-6 hours as needed for cough/wheeze/shortness of breath/chest tightness.  May use 15-20 minutes prior to activity.   Monitor frequency of use.   -asthma add-on therapies discussed step up therapy which include injection based medications like Nucala, Fasenra, Dupixent, Tezspire to name a few.   We will obtain more labwork to determine if you may be eligible for these options.  Will plan to discuss this in more detail one we know what you qualify for  Asthma control goals:  Full participation in all desired activities (may need albuterol before  activity) Albuterol use two time or less a week on average (not counting use with activity) Cough interfering with sleep two time or less a month Oral steroids no more than once a year No hospitalizations  Allergic rhinitis with conjunctivitis -for allergy symptom control continue Zyrtec '10mg'$  daily as needed -continue Flonase 2 sprays each nostril daily for 1-2 weeks at a time before stopping once nasal congestion improves for maximum benefit -for itchy/watery eyes can use OTC Pataday 1 drop each eye daily as needed  Food intolerance/adverse food reaction -will obtain serum IgE levels for nut panel, coconut, cabbage and strawberry -I do not think you are truly allergic to these foods and most likely sensitized only but will see was lab shows - we have discussed the following in regards to foods:   Allergy: food allergy is when you have eaten a food, developed an allergic reaction after  eating the food and have IgE to the food (positive food testing either by skin testing or blood testing).  Food allergy could lead to life threatening symptoms  Sensitivity: occurs when you have IgE to a food (positive food testing either by skin testing or blood testing) but is a food you eat without any issues.  This is not an allergy and we recommend keeping the food in the diet  Intolerance: this is when you have negative testing by either skin testing or blood testing thus not allergic but the food causes symptoms (like belly pain, bloating, diarrhea etc) with ingestion.  These foods should be avoided to prevent symptoms.     Follow-up in 2-3 months or sooner if needed  I appreciate the opportunity to take part in Suzanne Harvey's care. Please do not hesitate to contact me with questions.  Sincerely,   Prudy Feeler, MD Allergy/Immunology [facility]

## 2021-03-17 LAB — ALLERGENS(7)
Brazil Nut IgE: <0.1 kU/L
F020-IgE Almond: <0.1 kU/L
F202-IgE Cashew Nut: <0.1 kU/L
Hazelnut (Filbert) IgE: <0.1 kU/L
Peanut IgE: <0.1 kU/L
Pecan Nut IgE: <0.1 kU/L
Walnut IgE: <0.1 kU/L

## 2021-03-17 LAB — ALLERGEN, CABBAGE, F216: Allergen Cabbage IgE: <0.1 kU/L

## 2021-03-17 LAB — ALLERGEN, STRAWBERRY, F44: Allergen Strawberry IgE: <0.1 kU/L

## 2021-03-17 LAB — ALLERGEN COCONUT IGE: Allergen Coconut IgE: 0.11 kU/L — AB

## 2021-03-18 LAB — CBC WITH DIFFERENTIAL
Basophils Absolute: 0 10*3/uL (ref 0.0–0.2)
Basos: 1 %
EOS (ABSOLUTE): 0.1 10*3/uL (ref 0.0–0.4)
Eos: 1 %
Hematocrit: 34.7 % (ref 34.0–46.6)
Hemoglobin: 10.9 g/dL — ABNORMAL LOW (ref 11.1–15.9)
Immature Grans (Abs): 0 10*3/uL (ref 0.0–0.1)
Immature Granulocytes: 0 %
Lymphocytes Absolute: 1.8 10*3/uL (ref 0.7–3.1)
Lymphs: 26 %
MCH: 30.6 pg (ref 26.6–33.0)
MCHC: 31.4 g/dL — ABNORMAL LOW (ref 31.5–35.7)
MCV: 98 fL — ABNORMAL HIGH (ref 79–97)
Monocytes Absolute: 0.5 10*3/uL (ref 0.1–0.9)
Monocytes: 8 %
Neutrophils Absolute: 4.5 10*3/uL (ref 1.4–7.0)
Neutrophils: 64 %
RBC: 3.56 x10E6/uL — ABNORMAL LOW (ref 3.77–5.28)
RDW: 12.8 % (ref 11.7–15.4)
WBC: 6.9 10*3/uL (ref 3.4–10.8)

## 2021-03-18 LAB — ALLERGENS W/TOTAL IGE AREA 2
Alternaria Alternata IgE: 0.22 kU/L — AB
Aspergillus Fumigatus IgE: <0.1 kU/L
Bermuda Grass IgE: <0.1 kU/L
Cat Dander IgE: <0.1 kU/L
Cedar, Mountain IgE: <0.1 kU/L
Cladosporium Herbarum IgE: 0.23 kU/L — AB
Cockroach, German IgE: 0.42 kU/L — AB
Common Silver Birch IgE: <0.1 kU/L
Cottonwood IgE: <0.1 kU/L
D Farinae IgE: 8.14 kU/L — AB
D Pteronyssinus IgE: 6.11 kU/L — AB
Dog Dander IgE: <0.1 kU/L
Elm, American IgE: <0.1 kU/L
IgE (Immunoglobulin E), Serum: 581 IU/mL — ABNORMAL HIGH (ref 6–495)
Johnson Grass IgE: <0.1 kU/L
Maple/Box Elder IgE: <0.1 kU/L
Mouse Urine IgE: <0.1 kU/L
Oak, White IgE: <0.1 kU/L
Pecan, Hickory IgE: <0.1 kU/L
Penicillium Chrysogen IgE: <0.1 kU/L
Pigweed, Rough IgE: <0.1 kU/L
Ragweed, Short IgE: <0.1 kU/L
Sheep Sorrel IgE Qn: <0.1 kU/L
Timothy Grass IgE: <0.1 kU/L
White Mulberry IgE: <0.1 kU/L

## 2021-03-22 DIAGNOSIS — E785 Hyperlipidemia, unspecified: Secondary | ICD-10-CM | POA: Diagnosis not present

## 2021-03-22 DIAGNOSIS — I10 Essential (primary) hypertension: Secondary | ICD-10-CM | POA: Diagnosis not present

## 2021-03-22 DIAGNOSIS — M159 Polyosteoarthritis, unspecified: Secondary | ICD-10-CM | POA: Diagnosis not present

## 2021-04-22 DIAGNOSIS — M159 Polyosteoarthritis, unspecified: Secondary | ICD-10-CM | POA: Diagnosis not present

## 2021-04-22 DIAGNOSIS — E785 Hyperlipidemia, unspecified: Secondary | ICD-10-CM | POA: Diagnosis not present

## 2021-04-22 DIAGNOSIS — I10 Essential (primary) hypertension: Secondary | ICD-10-CM | POA: Diagnosis not present

## 2021-05-22 DIAGNOSIS — I10 Essential (primary) hypertension: Secondary | ICD-10-CM | POA: Diagnosis not present

## 2021-05-22 DIAGNOSIS — M159 Polyosteoarthritis, unspecified: Secondary | ICD-10-CM | POA: Diagnosis not present

## 2021-05-22 DIAGNOSIS — E785 Hyperlipidemia, unspecified: Secondary | ICD-10-CM | POA: Diagnosis not present

## 2021-06-15 ENCOUNTER — Other Ambulatory Visit: Payer: Self-pay | Admitting: Internal Medicine

## 2021-06-18 ENCOUNTER — Ambulatory Visit: Payer: Medicare Other | Admitting: Allergy

## 2021-06-22 DIAGNOSIS — M159 Polyosteoarthritis, unspecified: Secondary | ICD-10-CM | POA: Diagnosis not present

## 2021-06-22 DIAGNOSIS — I10 Essential (primary) hypertension: Secondary | ICD-10-CM | POA: Diagnosis not present

## 2021-06-22 DIAGNOSIS — E785 Hyperlipidemia, unspecified: Secondary | ICD-10-CM | POA: Diagnosis not present

## 2021-06-26 ENCOUNTER — Other Ambulatory Visit: Payer: Self-pay | Admitting: Pulmonary Disease

## 2021-06-26 DIAGNOSIS — J4520 Mild intermittent asthma, uncomplicated: Secondary | ICD-10-CM | POA: Diagnosis not present

## 2021-06-26 DIAGNOSIS — R7303 Prediabetes: Secondary | ICD-10-CM | POA: Diagnosis not present

## 2021-06-26 DIAGNOSIS — E559 Vitamin D deficiency, unspecified: Secondary | ICD-10-CM | POA: Diagnosis not present

## 2021-06-26 DIAGNOSIS — I10 Essential (primary) hypertension: Secondary | ICD-10-CM | POA: Diagnosis not present

## 2021-06-26 DIAGNOSIS — E785 Hyperlipidemia, unspecified: Secondary | ICD-10-CM | POA: Diagnosis not present

## 2021-06-26 DIAGNOSIS — M159 Polyosteoarthritis, unspecified: Secondary | ICD-10-CM | POA: Diagnosis not present

## 2021-06-26 DIAGNOSIS — Z23 Encounter for immunization: Secondary | ICD-10-CM | POA: Diagnosis not present

## 2021-06-29 ENCOUNTER — Ambulatory Visit: Payer: Medicare Other | Admitting: Internal Medicine

## 2021-07-08 ENCOUNTER — Encounter: Payer: Self-pay | Admitting: Internal Medicine

## 2021-07-08 ENCOUNTER — Ambulatory Visit: Payer: Medicare Other | Admitting: Internal Medicine

## 2021-07-08 ENCOUNTER — Other Ambulatory Visit: Payer: Self-pay

## 2021-07-08 DIAGNOSIS — J4550 Severe persistent asthma, uncomplicated: Secondary | ICD-10-CM

## 2021-07-08 MED ORDER — ALBUTEROL SULFATE HFA 108 (90 BASE) MCG/ACT IN AERS: INHALATION_SPRAY | RESPIRATORY_TRACT | 2 refills | Status: DC

## 2021-07-08 NOTE — Patient Instructions (Signed)
No change medications  See Dr Nelva Bush   Please schedule a follow up visit in 6  months but call sooner if needed

## 2021-07-08 NOTE — Progress Notes (Signed)
Subjective:     Patient ID: Suzanne Harvey, female   DOB: 03-Aug-1946   MRN: [id number]    Brief patient profile:  35  yobf never smoker  with  severe chronic asthma (since childhood)  with an irreversible component improved to FEV1 70% 09/30/2011    History of Present Illness  03/16/2013 f/u ov/Suzanne Harvey re asthma, confused with instructions/ no longer using med calendar provided Chief Complaint  Patient presents with   Follow-up    last seen 09/2011. Pt reports she has good and bad days w/ breathing. Denies any cough, no wheezing, no chest tx  variable sob but not needing any saba, using qvar bid and symbicort 160 once a day.  rec Symbicort 160 Take 2 puffs first thing in am and then another 2 puffs about 12 hours later.  Qvar 80 2 puffs immediately after am dose of symbicort (only in am, only need the 15 min delay if short of breath or tight in am) Only use your albuterol (xopenex) as a rescue medication      05/09/2017  f/u ov/Suzanne Harvey re: severe chronic asthma  singulair maint plus symb 160 2bid/out of qvar x 3 weeks, saba just a few times a week  Chief Complaint  Patient presents with   Follow-up    pt c/o prod cough with yellow/green mucus, sob X1 day.     acutally at bottom part of red on symbicort also now and just starting to "come down with a cold" in last 24 h prior to OV   Typically while on rx: Not limited by breathing from desired activities / avg use of saba 1-2 x weekly baseline, more since running low on meds   rec Change qvar to the redihaler = Take 2 puffs first thing in am and then another 2 puffs about 12 hours later if having any trouble with breathing or coughing or an increased need for albuterol  No change symbicort 160 Take 2 puffs first thing in am and then another 2 puffs about 12 hours later.  Call if issues with your insurance paying for your medications - we will need a list of alternatives from your insurance(formular) or the pharmacy If doing well, return yearly for  recheck office visit - otherwise we'll see you in a year     08/02/2018  f/u ov/Suzanne Harvey re: severe chronic asthma on symb 160 2bid/ singulair/qvar80 Chief Complaint  Patient presents with   Follow-up    Breathing is doing well. She is using her proair inhaler once per wk on average.   Dyspnea:  Not limited by breathing from desired activities  = lots of walking = [place] sometime/   stationery bike ok Cough: none Sleeping: fine one pillow SABA use: rarely albuterol 02: none   rec Plan A = Automatic = symbicort 160 and asmanex 200 2 puffs of each in am and then repeat 12 hours later Work on inhaler technique:  Plan B = Backup Only use your albuterol inhaler as a rescue medication Plan C = Crisis - Prednisone 10 mg take  4 each am x 2 days,   2 each am x 2 days,  1 each am x 2 days and stop  If not able to afford the asmanex on your plan, return with your drug formulary before asmanex  runs out     09/26/2020  f/u ov/Suzanne Harvey re:  Off qvar and asmanex   And maint just symbicort 160 / singualir x months  Chief Complaint  Patient presents with   Follow-up    Breathing is overall doing well. She does have some increased SOB during rainy weather like today. She is using her albuterol inhaler 2-3 x per day on average.   Dyspnea:  Treadmill x 2 per week, walks 3-4 min flat very slow speed  Cough: none Sleeping: fine flat bed, one pillow SABA use: usually p exertion, never pre challenges  02: none  rec Plan A = Automatic = Always =   Symbicort  160 Take 2 puffs first thing in am and then another 2 puffs about 12 hours later.  Work on inhaler technique:  Plan B = Backup (to supplement plan A, not to replace it) Only use your albuterol inhaler as a rescue medication Ok to Try albuterol 15 min before an activity that you know would make you short of breath   Plan C = Crisis (only if Plan B stops working well and needing more than usual)  - Prednisone 10 mg take  4 each am x 2 days,   2 each  am x 2 days,  1 each am x 2 days and stop    12/29/2020  f/u ov/Suzanne Harvey re: severe chronic asthma  Last pred 3 weeks prior on symbicort 160/singulair / freq saba Chief Complaint  Patient presents with   Follow-up    PFT done today. She states her breathing is "average" today. She is using her albuterol inhaler 2 x per day.    Dyspnea:  Prefers  Bicycle x 25 min 3 x weekly denies ever being  Cough: none  Sleeping: flat bed/ one pillow  SABA use: avg 2 x daily  02: none Covid status:   vax x 3  Rec Change symbicort  160 to breztri Take 2 puffs first thing in am and then another 2 puffs about 12 hours later.  Work on inhaler technique:  We will calling you to refer to [facility]> saw Nelva Bush 03/13/21  but has not made f/u appt as rec    07/08/2021  f/u ov/Suzanne Harvey re: severe chronic asthma   maint on breztri 2bid Chief Complaint  Patient presents with   Follow-up    Breathing is overall doing well. She has some throat clearing and occ yellow sputum in the am's.    Dyspnea:  bicycle 30 min  3 x weekly/house to park  Cough: am's assoc with pnds x severe Sleeping: bed is flat / one pillow SABA use: maybe twice  02: none  Covid status:   vax x 5    No obvious day to day or daytime variability or assoc excess/ purulent sputum or mucus plugs or hemoptysis or cp or chest tightness, subjective wheeze or overt  hb symptoms.     Also denies any obvious fluctuation of symptoms with weather or environmental changes or other aggravating or alleviating factors except as outlined above   No unusual exposure hx or h/o childhood pna or knowledge of premature birth.  Current Allergies, Complete Past Medical History, Past Surgical History, Family History, and Social History were reviewed in [employer] record.  ROS  The following are not active complaints unless bolded Hoarseness, sore throat, dysphagia, dental problems, itching, sneezing,  nasal congestion  or discharge of excess mucus or purulent secretions, ear ache,   fever, chills, sweats, unintended wt loss or wt gain, classically pleuritic or exertional cp,  orthopnea pnd or arm/hand swelling  or leg swelling, presyncope, palpitations, abdominal pain,  anorexia, nausea, vomiting, diarrhea  or change in bowel habits or change in bladder habits, change in stools or change in urine, dysuria, hematuria,  rash, arthralgias, visual complaints, headache, numbness, weakness or ataxia or problems with walking or coordination,  change in mood or  memory.        Current Meds  Medication Sig   albuterol (VENTOLIN HFA) 108 (90 Base) MCG/ACT inhaler INHALE TWO PUFFS BY MOUTH INTO LUNGS into THE lungs EVERY 4 HOURS AS NEEDED FOR SHORTNESS OF BREATH OR wheezing   BREZTRI AEROSPHERE 160-9-4.8 MCG/ACT AERO Inhale 2 puffs into the lungs in the morning and at bedtime.   ferrous sulfate 325 (65 FE) MG tablet Take 325 mg by mouth daily with breakfast.   fluticasone (FLONASE) 50 MCG/ACT nasal spray PLACE 1 SPRAY INTO BOTH NOSTRILS DAILY.   montelukast (SINGULAIR) 10 MG tablet TAKE ONE TABLET BY MOUTH EVERYDAY AT BEDTIME   Multiple Vitamin (MULTIVITAMIN) capsule Take 1 capsule by mouth daily.   rosuvastatin (CRESTOR) 10 MG tablet Take 10 mg by mouth daily.   valsartan-hydrochlorothiazide (DIOVAN-HCT) 80-12.5 MG per tablet Take 1 tablet by mouth daily.                Past Medical History:  GASTROESOPHAGEAL REFLUX DISEASE (ICD-530.81)  ASTHMA (ICD-493.90)  - pft's 08/11/2007 FEV1 1.15 ratio 43% with 13% improvement after bronchodilators and nl DLC0  - nl alpha1AT 05/13/2006       Objective:   Physical Exam  07/08/2021  154 12/29/2020     151  09/26/2020    150 08/02/2018 154 04/19/2016   156     03/16/2013  164     Vital signs reviewed  07/08/2021  - Note at rest 02 sats  100% on RA   General appearance:    amb bf nad   HEENT : pt wearing mask not removed for exam due to covid - 19 concerns.   NECK :   without JVD/Nodes/TM/ nl carotid upstrokes bilaterally   LUNGS: no acc muscle use,  Min barrel  contour chest wall with bilateral  slightly decreased bs s audible wheeze and  without cough on insp or exp maneuvers and min  Hyperresonant  to  percussion bilaterally     CV:  RRR  no s3 or murmur or increase in P2, and no edema   ABD:  soft and nontender with pos end  insp Hoover's  in the supine position. No bruits or organomegaly appreciated, bowel sounds nl  MS:   Nl gait/  ext warm without deformities, calf tenderness, cyanosis or clubbing No obvious joint restrictions   SKIN: warm and dry without lesions    NEURO:  alert, approp, nl sensorium with  no motor or cerebellar deficits apparent.            Assessment:

## 2021-07-08 NOTE — Assessment & Plan Note (Signed)
Onset in childhood     - PFT's 09/30/2011  FEV1  1.34 (70%) ratio 44 and no change p B2, DLCO 62 corrects to 95%    - PFT's 04/03/2014  FEV1 0.91 (60%) ratio 42 and no change p B2 and DLCO 90%   - FENO 04/19/2016  =   15 on symbicort 160/ qvar 80 2bid each one > no change rx  - PFT's 03/09/2019  FEV1 0.95 (68 % ) ratio 0.44  p 1 % improvement from saba p nothing  prior to study with DLCO  13.51 (81%) corrects to 3.84 (90%)  for alv volume and FV curve typical airflow obst  - 09/26/2020  After extensive coaching inhaler device,  effectiveness =    75% (short Ti) Plan C = Crisis (only if Plan B stops working well and needing more than usual)  - Prednisone 10 mg take  4 each am x 2 days,   2 each am x 2 days,  1 each am x 2 days and stop  - PFT's  12/29/2020  FEV1 1.05 (74 % ) ratio 0.51  p 13 % improvement from saba p albuterol x one hour prior to study with DLCO  12.54 (73%) corrects to 3.40 (80%)  for alv volume and FV curve severe concavity    All goals of chronic asthma control met including optimal function (though not nl)  and elimination of symptoms with minimal need for rescue therapy.  Contingencies discussed in full including contacting this office immediately if not controlling the symptoms using the rule of two's.     Rec: No change rx F/u with Dr Driscilla Moats re suboptimally  controlled nasal symptoms suggesting allergic mech  >>> f/u here q 6 m unless being followed regularly by Allergy        Each maintenance medication was reviewed in detail including emphasizing most importantly the difference between maintenance and prns and under what circumstances the prns are to be triggered using an action plan format where appropriate.  Total time for H and P, chart review, counseling, reviewing hfa  device(s) and generating customized AVS unique to this office visit / same day charting = 21 min

## 2021-07-22 DIAGNOSIS — I10 Essential (primary) hypertension: Secondary | ICD-10-CM | POA: Diagnosis not present

## 2021-07-22 DIAGNOSIS — E785 Hyperlipidemia, unspecified: Secondary | ICD-10-CM | POA: Diagnosis not present

## 2021-07-22 DIAGNOSIS — M159 Polyosteoarthritis, unspecified: Secondary | ICD-10-CM | POA: Diagnosis not present

## 2021-08-21 DIAGNOSIS — I10 Essential (primary) hypertension: Secondary | ICD-10-CM | POA: Diagnosis not present

## 2021-08-21 DIAGNOSIS — E785 Hyperlipidemia, unspecified: Secondary | ICD-10-CM | POA: Diagnosis not present

## 2021-08-21 DIAGNOSIS — M159 Polyosteoarthritis, unspecified: Secondary | ICD-10-CM | POA: Diagnosis not present

## 2021-09-20 DIAGNOSIS — M159 Polyosteoarthritis, unspecified: Secondary | ICD-10-CM | POA: Diagnosis not present

## 2021-09-20 DIAGNOSIS — I10 Essential (primary) hypertension: Secondary | ICD-10-CM | POA: Diagnosis not present

## 2021-09-20 DIAGNOSIS — E785 Hyperlipidemia, unspecified: Secondary | ICD-10-CM | POA: Diagnosis not present

## 2021-09-23 ENCOUNTER — Other Ambulatory Visit: Payer: Self-pay | Admitting: Internal Medicine

## 2021-09-25 DIAGNOSIS — H40013 Open angle with borderline findings, low risk, bilateral: Secondary | ICD-10-CM | POA: Diagnosis not present

## 2021-10-01 ENCOUNTER — Other Ambulatory Visit: Payer: Self-pay

## 2021-10-01 ENCOUNTER — Ambulatory Visit (INDEPENDENT_AMBULATORY_CARE_PROVIDER_SITE_OTHER): Payer: Medicare Other | Admitting: Allergy

## 2021-10-01 ENCOUNTER — Encounter: Payer: Self-pay | Admitting: Allergy

## 2021-10-01 VITALS — BP 122/72 | HR 64 | Temp 97.7°F | Resp 18 | Ht 61.0 in | Wt 157.2 lb

## 2021-10-01 DIAGNOSIS — J4550 Severe persistent asthma, uncomplicated: Secondary | ICD-10-CM | POA: Diagnosis not present

## 2021-10-01 DIAGNOSIS — T781XXD Other adverse food reactions, not elsewhere classified, subsequent encounter: Secondary | ICD-10-CM

## 2021-10-01 DIAGNOSIS — J3089 Other allergic rhinitis: Secondary | ICD-10-CM

## 2021-10-01 DIAGNOSIS — H1013 Acute atopic conjunctivitis, bilateral: Secondary | ICD-10-CM

## 2021-10-01 MED ORDER — AZELASTINE HCL 0.1 % NA SOLN: NASAL | 5 refills | Status: AC

## 2021-10-01 NOTE — Patient Instructions (Addendum)
°-  continue Breztri 2 puffs twice a day -have access to albuterol inhaler 2 puffs every 4-6 hours as needed for cough/wheeze/shortness of breath/chest tightness.  May use 15-20 minutes prior to activity.   Monitor frequency of use.   -asthma add-on therapies discussed and if we need to step-up therapy you would be eligible for Tezspire monthly injections  Asthma control goals:  Full participation in all desired activities (may need albuterol before activity) Albuterol use two time or less a week on average (not counting use with activity) Cough interfering with sleep two time or less a month Oral steroids no more than once a year No hospitalizations  -for allergy symptom control continue Zyrtec 10mg  daily as needed -for nasal drainage/throat clearing start nasal antihistamine Astelin 1-2 sprays each nostril twice a day as needed -use Flonase 2 sprays each nostril daily for 1-2 weeks at a time before stopping once nasal congestion improves for maximum benefit -for itchy/watery eyes can use OTC Pataday 1 drop each eye daily as needed  -food allergy testing was reassuring and do not need to avoid any foods at this time - we have previously discussed the following in regards to foods:   Allergy: food allergy is when you have eaten a food, developed an allergic reaction after eating the food and have IgE to the food (positive food testing either by skin testing or blood testing).  Food allergy could lead to life threatening symptoms  Sensitivity: occurs when you have IgE to a food (positive food testing either by skin testing or blood testing) but is a food you eat without any issues.  This is not an allergy and we recommend keeping the food in the diet  Intolerance: this is when you have negative testing by either skin testing or blood testing thus not allergic but the food causes symptoms (like belly pain, bloating, diarrhea etc) with ingestion.  These foods should be avoided to prevent symptoms.      Follow-up in 4-6 months or sooner if needed

## 2021-10-01 NOTE — Progress Notes (Signed)
Follow-up Note  RE: Suzanne Harvey MRN: [id number] DOB: Mar 12, 1946 Date of Office Visit: 10/01/2021   History of present illness: Laurian Edrington Harvey is a 76 y.o. female presenting today for follow-up of asthma, allergic rhinitis with conjunctivitis and adverse food reaction.  She was last seen in the office on 03/13/2021 by myself.  She has been doing well since this visit without any major health changes, surgeries or hospitalizations. She states her breathing is much improved.  She is using Breztri 2 puffs twice a day.  She states with this she only is using her albuterol now about twice a week on average for symptoms.  Not having any nighttime awakenings.  She does not require any ED or urgent care visits or any systemic steroid needs. With her allergies she states year-round symptoms but she has not noted improvement as she is only having "some sneezing".  Her biggest issue right now is throat clearing throughout the day.  She states there is always something in her throat but she is trying to move out of the way.  She does use Flonase as needed as well as Zyrtec as needed. She is not avoiding anything in the diet at this time.  She did go back to eating coconut products in moderation and has tolerated this fine.  Review of systems: Review of Systems  Constitutional: Negative.   HENT:         See HPI  Eyes: Negative.   Respiratory:  Positive for cough.   Cardiovascular: Negative.   Gastrointestinal: Negative.   Musculoskeletal: Negative.   Skin: Negative.   Allergic/Immunologic: Negative.   Neurological: Negative.     All other systems negative unless noted above in HPI  Past medical/social/surgical/family history have been reviewed and are unchanged unless specifically indicated below.  No changes  Medication List: Current Outpatient Medications  Medication Sig Dispense Refill   albuterol (VENTOLIN HFA) 108 (90 Base) MCG/ACT inhaler Up to 2 puffs every 4 hours if can't catch your  breath 18 g 2   azelastine (ASTELIN) 0.1 % nasal spray 1-2 sprays each nostril twice a day as needed for nasal drainage and throat clearing. 30 mL 5   BREZTRI AEROSPHERE 160-9-4.8 MCG/ACT AERO Inhale 2 puffs into the lungs in the morning and at bedtime. 10.7 g 5   ferrous sulfate 325 (65 FE) MG tablet Take 325 mg by mouth daily with breakfast.     fluticasone (FLONASE) 50 MCG/ACT nasal spray PLACE 1 SPRAY INTO BOTH NOSTRILS DAILY. 48 mL 3   montelukast (SINGULAIR) 10 MG tablet TAKE ONE TABLET BY MOUTH EVERYDAY AT BEDTIME 90 tablet 2   Multiple Vitamin (MULTIVITAMIN) capsule Take 1 capsule by mouth daily.     rosuvastatin (CRESTOR) 10 MG tablet Take 10 mg by mouth daily.     valsartan-hydrochlorothiazide (DIOVAN-HCT) 80-12.5 MG per tablet Take 1 tablet by mouth daily.     No current facility-administered medications for this visit.     Known medication allergies: Allergies  Allergen Reactions   Penicillins    Sulfonamide Derivatives      Physical examination: Blood pressure 122/72, pulse 64, temperature 97.7 F (36.5 C), resp. rate 18, height 5\' 1"  (1.549 m), weight 157 lb 4 oz (71.3 kg), SpO2 98 %.  General: Alert, interactive, in no acute distress. HEENT: PERRLA, TMs pearly gray, turbinates minimally edematous without discharge, post-pharynx non erythematous. Neck: Supple without lymphadenopathy. Lungs: Clear to auscultation without wheezing, rhonchi or rales. {no increased work of breathing.  CV: Normal S1, S2 without murmurs. Abdomen: Nondistended, nontender. Skin: Warm and dry, without lesions or rashes. Extremities:  No clubbing, cyanosis or edema. Neuro:   Grossly intact.  Diagnositics/Labs: Labs:  Component     Latest Ref Rng & Units 03/13/2021 03/13/2021         3:13 PM  3:13 PM  MCV     79 - 97 fL 98 (H)   MCH     26.6 - 33.0 pg 30.6   MCHC     31.5 - 35.7 g/dL 31.4 (L)   RDW     11.7 - 15.4 % 12.8   Neutrophils     Not Estab. % 64   Lymphs     Not Estab. %  26   Monocytes     Not Estab. % 8   Eos     Not Estab. % 1   Basos     Not Estab. % 1   NEUT#     1.4 - 7.0 x10E3/uL 4.5   Lymphocyte #     0.7 - 3.1 x10E3/uL 1.8   Monocytes Absolute     0.1 - 0.9 x10E3/uL 0.5   EOS (ABSOLUTE)     0.0 - 0.4 x10E3/uL 0.1   Basophils Absolute     0.0 - 0.2 x10E3/uL 0.0   Immature Granulocytes     Not Estab. % 0   Immature Grans (Abs)     0.0 - 0.1 x10E3/uL 0.0   Peanut IgE     Class 0 kU/L <0.10   Hazelnut (Filbert) IgE     Class 0 kU/L <0.10   [place] Nut IgE     Class 0 kU/L <0.10   F020-IgE Almond     Class 0 kU/L <0.10   Pecan Nut IgE     Class 0 kU/L <0.10   F202-IgE Cashew Nut     Class 0 kU/L <0.10   Walnut IgE     Class 0 kU/L <0.10   Allergen Cabbage IgE     Class 0 kU/L <0.10   Allergen Strawberry IgE     Class 0 kU/L <0.10   Allergen Coconut IgE     Class 0/I kU/L 0.11 (A)     Spirometry: FEV1: 0.77L 55%, FVC: 1.93L 107%, ratio consistent with nonobstructive pattern  Assessment and plan: Severe persistent asthma  -continue Breztri 2 puffs twice a day -have access to albuterol inhaler 2 puffs every 4-6 hours as needed for cough/wheeze/shortness of breath/chest tightness.  May use 15-20 minutes prior to activity.   Monitor frequency of use.   -asthma add-on therapies discussed and if we need to step-up therapy you would be eligible for Tezspire monthly injections  Asthma control goals:  Full participation in all desired activities (may need albuterol before activity) Albuterol use two time or less a week on average (not counting use with activity) Cough interfering with sleep two time or less a month Oral steroids no more than once a year No hospitalizations  Allergic rhinitis and conjunctivitis -for allergy symptom control continue Zyrtec 10mg  daily as needed -for nasal drainage/throat clearing start nasal antihistamine Astelin 1-2 sprays each nostril twice a day as needed -use Flonase 2 sprays each nostril daily  for 1-2 weeks at a time before stopping once nasal congestion improves for maximum benefit -for itchy/watery eyes can use OTC Pataday 1 drop each eye daily as needed  Adverse food reaction -food allergy testing was reassuring and do not need to avoid any  foods at this time - we have previously discussed the following in regards to foods:   Allergy: food allergy is when you have eaten a food, developed an allergic reaction after eating the food and have IgE to the food (positive food testing either by skin testing or blood testing).  Food allergy could lead to life threatening symptoms  Sensitivity: occurs when you have IgE to a food (positive food testing either by skin testing or blood testing) but is a food you eat without any issues.  This is not an allergy and we recommend keeping the food in the diet  Intolerance: this is when you have negative testing by either skin testing or blood testing thus not allergic but the food causes symptoms (like belly pain, bloating, diarrhea etc) with ingestion.  These foods should be avoided to prevent symptoms.     Follow-up in 4-6 months or sooner if needed  I appreciate the opportunity to take part in Ambyr's care. Please do not hesitate to contact me with questions.  Sincerely,   Prudy Feeler, MD Allergy/Immunology [facility]

## 2021-10-16 DIAGNOSIS — I10 Essential (primary) hypertension: Secondary | ICD-10-CM | POA: Diagnosis not present

## 2021-10-16 DIAGNOSIS — Z0000 Encounter for general adult medical examination without abnormal findings: Secondary | ICD-10-CM | POA: Diagnosis not present

## 2021-10-16 DIAGNOSIS — J309 Allergic rhinitis, unspecified: Secondary | ICD-10-CM | POA: Diagnosis not present

## 2021-10-16 DIAGNOSIS — J45909 Unspecified asthma, uncomplicated: Secondary | ICD-10-CM | POA: Diagnosis not present

## 2021-10-16 DIAGNOSIS — M1990 Unspecified osteoarthritis, unspecified site: Secondary | ICD-10-CM | POA: Diagnosis not present

## 2021-10-20 DIAGNOSIS — E785 Hyperlipidemia, unspecified: Secondary | ICD-10-CM | POA: Diagnosis not present

## 2021-10-20 DIAGNOSIS — M159 Polyosteoarthritis, unspecified: Secondary | ICD-10-CM | POA: Diagnosis not present

## 2021-10-20 DIAGNOSIS — I10 Essential (primary) hypertension: Secondary | ICD-10-CM | POA: Diagnosis not present

## 2021-11-20 DIAGNOSIS — M159 Polyosteoarthritis, unspecified: Secondary | ICD-10-CM | POA: Diagnosis not present

## 2021-11-20 DIAGNOSIS — E785 Hyperlipidemia, unspecified: Secondary | ICD-10-CM | POA: Diagnosis not present

## 2021-11-20 DIAGNOSIS — I10 Essential (primary) hypertension: Secondary | ICD-10-CM | POA: Diagnosis not present

## 2021-12-20 DIAGNOSIS — I10 Essential (primary) hypertension: Secondary | ICD-10-CM | POA: Diagnosis not present

## 2021-12-20 DIAGNOSIS — E785 Hyperlipidemia, unspecified: Secondary | ICD-10-CM | POA: Diagnosis not present

## 2022-01-06 ENCOUNTER — Ambulatory Visit: Payer: Medicare Other | Admitting: Internal Medicine

## 2022-01-11 ENCOUNTER — Encounter: Payer: Self-pay | Admitting: Internal Medicine

## 2022-01-11 ENCOUNTER — Ambulatory Visit: Payer: Medicare Other | Admitting: Internal Medicine

## 2022-01-11 DIAGNOSIS — J4550 Severe persistent asthma, uncomplicated: Secondary | ICD-10-CM

## 2022-01-11 NOTE — Patient Instructions (Signed)
No change in medications   Please schedule a follow up visit in 12  months but call sooner if needed  

## 2022-01-11 NOTE — Assessment & Plan Note (Signed)
Onset in childhood     - PFT's 09/30/2011  FEV1  1.34 (70%) ratio 44 and no change p B2, DLCO 62 corrects to 95%    - PFT's 04/03/2014  FEV1 0.91 (60%) ratio 42 and no change p B2 and DLCO 90%   - FENO 04/19/2016  =   15 on symbicort 160/ qvar 80 2bid each one > no change rx  - PFT's 03/09/2019  FEV1 0.95 (68 % ) ratio 0.44  p 1 % improvement from saba p nothing  prior to study with DLCO  13.51 (81%) corrects to 3.84 (90%)  for alv volume and FV curve typical airflow obst  - 09/26/2020  After extensive coaching inhaler device,  effectiveness =    75% (short Ti) Plan C = Crisis (only if Plan B stops working well and needing more than usual)  - Prednisone 10 mg take  4 each am x 2 days,   2 each am x 2 days,  1 each am x 2 days and stop  - PFT's  12/29/2020  FEV1 1.05 (74 % ) ratio 0.51  p 13 % improvement from saba p albuterol x one hour prior to study with DLCO  12.54 (73%) corrects to 3.40 (80%)  for alv volume and FV curve severe concavity    All goals of chronic asthma control met including optimal function and elimination of symptoms with minimal need for rescue therapy.  Contingencies discussed in full including contacting this office immediately if not controlling the symptoms using the rule of two's.     - The proper method of use, as well as anticipated side effects, of a metered-dose inhaler were discussed and demonstrated to the patient using teach back method.   F/u yearly at this point / can refill all asthma/allergy meds here at her request and f/u with allergy prn           Each maintenance medication was reviewed in detail including emphasizing most importantly the difference between maintenance and prns and under what circumstances the prns are to be triggered using an action plan format where appropriate.  Total time for H and P, chart review, counseling, reviewing hfa device(s) and generating customized AVS unique to this office visit / same day charting = 20 min

## 2022-01-11 NOTE — Progress Notes (Signed)
Subjective:     Patient ID: Suzanne Harvey, female   DOB: [birth year]   MRN: [id number]    Brief patient profile:  82  yobf never smoker  with  severe chronic asthma (since childhood)  with an irreversible component improved to FEV1 70% 09/30/2011    History of Present Illness  03/16/2013 f/u ov/Suzanne Harvey re asthma, confused with instructions/ no longer using med calendar provided Chief Complaint  Patient presents with   Follow-up    last seen 09/2011. Pt reports she has good and bad days w/ breathing. Denies any cough, no wheezing, no chest tx  variable sob but not needing any saba, using qvar bid and symbicort 160 once a day.  rec Symbicort 160 Take 2 puffs first thing in am and then another 2 puffs about 12 hours later.  Qvar 80 2 puffs immediately after am dose of symbicort (only in am, only need the 15 min delay if short of breath or tight in am) Only use your albuterol (xopenex) as a rescue medication      09/26/2020  f/u ov/Suzanne Harvey re:  Off qvar and asmanex   And maint just symbicort 160 / singualir x months  Chief Complaint  Patient presents with   Follow-up    Breathing is overall doing well. She does have some increased SOB during rainy weather like today. She is using her albuterol inhaler 2-3 x per day on average.   Dyspnea:  Treadmill x 2 per week, walks 3-4 min flat very slow speed  Cough: none Sleeping: fine flat bed, one pillow SABA use: usually p exertion, never pre challenges  02: none  rec Plan A = Automatic = Always =   Symbicort  160 Take 2 puffs first thing in am and then another 2 puffs about 12 hours later.  Work on inhaler technique:  Plan B = Backup (to supplement plan A, not to replace it) Only use your albuterol inhaler as a rescue medication Ok to Try albuterol 15 min before an activity that you know would make you short of breath   Plan C = Crisis (only if Plan B stops working well and needing more than usual)  - Prednisone 10 mg take  4 each am x 2 days,   2 each am  x 2 days,  1 each am x 2 days and stop    12/29/2020  f/u ov/Suzanne Harvey re: severe chronic asthma  Last pred 3 weeks prior on symbicort 160/singulair / freq saba Chief Complaint  Patient presents with   Follow-up    PFT done today. She states her breathing is "average" today. She is using her albuterol inhaler 2 x per day.    Dyspnea:  Prefers  Bicycle x 25 min 3 x weekly denies ever being  Cough: none  Sleeping: flat bed/ one pillow  SABA use: avg 2 x daily  02: none Covid status:   vax x 3  Rec Change symbicort  160 to breztri Take 2 puffs first thing in am and then another 2 puffs about 12 hours later.  Work on inhaler technique:  We will calling you to refer to Dr Bruna Potter [facility] / [facility]> saw [facility] 03/13/21  > just rec flonase     01/11/2022  f/u ov/Suzanne Harvey re: severe chronic asthma   maint on brezti  2 bid / no Plan D this year (prednisone)  Chief Complaint  Patient presents with   Follow-up    Doing well.No problems breathing at this  time.  Dyspnea:  bicycle 30 min tid Cough: none Sleeping: no prob one pillow flat  SABA use: once a week 02: none    No obvious day to day or daytime variability or assoc excess/ purulent sputum or mucus plugs or hemoptysis or cp or chest tightness, subjective wheeze or overt sinus or hb symptoms.   Sleeping  without nocturnal  or early am exacerbation  of respiratory  c/o's or need for noct saba. Also denies any obvious fluctuation of symptoms with weather or environmental changes or other aggravating or alleviating factors except as outlined above   No unusual exposure hx or h/o childhood pna  or knowledge of premature birth.  Current Allergies, Complete Past Medical History, Past Surgical History, Family History, and Social History were reviewed in [employer] record.  ROS  The following are not active complaints unless bolded Hoarseness, sore throat, dysphagia, dental problems, itching, sneezing,  nasal congestion  or discharge of excess mucus or purulent secretions, ear ache,   fever, chills, sweats, unintended wt loss or wt gain, classically pleuritic or exertional cp,  orthopnea pnd or arm/hand swelling  or leg swelling, presyncope, palpitations, abdominal pain, anorexia, nausea, vomiting, diarrhea  or change in bowel habits or change in bladder habits, change in stools or change in urine, dysuria, hematuria,  rash, arthralgias, visual complaints, headache, numbness, weakness or ataxia or problems with walking or coordination,  change in mood or  memory.        Current Meds  Medication Sig   albuterol (VENTOLIN HFA) 108 (90 Base) MCG/ACT inhaler Up to 2 puffs every 4 hours if can't catch your breath   azelastine (ASTELIN) 0.1 % nasal spray 1-2 sprays each nostril twice a day as needed for nasal drainage and throat clearing.   BREZTRI AEROSPHERE 160-9-4.8 MCG/ACT AERO Inhale 2 puffs into the lungs in the morning and at bedtime.   fluticasone (FLONASE) 50 MCG/ACT nasal spray PLACE 1 SPRAY INTO BOTH NOSTRILS DAILY.   montelukast (SINGULAIR) 10 MG tablet TAKE ONE TABLET BY MOUTH EVERYDAY AT BEDTIME   Multiple Vitamin (MULTIVITAMIN) capsule Take 1 capsule by mouth daily.   rosuvastatin (CRESTOR) 10 MG tablet Take 10 mg by mouth daily.   valsartan-hydrochlorothiazide (DIOVAN-HCT) 80-12.5 MG per tablet Take 1 tablet by mouth daily.                         Past Medical History:  GASTROESOPHAGEAL REFLUX DISEASE (ICD-530.81)  ASTHMA (ICD-493.90)  - pft's 08/11/2007 FEV1 1.15 ratio 43% with 13% improvement after bronchodilators and nl DLC0  - nl alpha1AT 05/13/2006       Objective:   Physical Exam  Wts  01/11/2022     152 07/08/2021  154 12/29/2020     151  09/26/2020    150 08/02/2018 154 04/19/2016   156     03/16/2013  164     Vital signs reviewed  01/11/2022  - Note at rest 02 sats  100% on RA   General appearance:    amb bf all smiles    HEENT : edentuous/ mil TE edema s cyanosis/  polyps    NECK :  without JVD/Nodes/TM/ nl carotid upstrokes bilaterally   LUNGS: no acc muscle use,  Min barrel  contour chest wall with bilateral  slightly decreased bs s audible wheeze and  without cough on insp or exp maneuvers and min  Hyperresonant  to  percussion bilaterally    CV:  RRR  no s3 or murmur or increase in P2, and no edema   ABD:  soft and nontender with pos end  insp Hoover's  in the supine position.  No bruits or organomegaly appreciated   MS:  Nl gait/ ext warm without deformities Or obvious joint restrictions  calf tenderness, cyanosis or clubbing     SKIN: warm and dry without lesions    NEURO:  alert, approp, nl sensorium with  no motor or cerebellar deficits apparent.               Assessment:

## 2022-01-20 DIAGNOSIS — I10 Essential (primary) hypertension: Secondary | ICD-10-CM | POA: Diagnosis not present

## 2022-01-20 DIAGNOSIS — E785 Hyperlipidemia, unspecified: Secondary | ICD-10-CM | POA: Diagnosis not present

## 2022-01-21 DIAGNOSIS — M159 Polyosteoarthritis, unspecified: Secondary | ICD-10-CM | POA: Diagnosis not present

## 2022-01-21 DIAGNOSIS — I10 Essential (primary) hypertension: Secondary | ICD-10-CM | POA: Diagnosis not present

## 2022-01-21 DIAGNOSIS — J4520 Mild intermittent asthma, uncomplicated: Secondary | ICD-10-CM | POA: Diagnosis not present

## 2022-01-21 DIAGNOSIS — E1169 Type 2 diabetes mellitus with other specified complication: Secondary | ICD-10-CM | POA: Diagnosis not present

## 2022-01-21 DIAGNOSIS — I129 Hypertensive chronic kidney disease with stage 1 through stage 4 chronic kidney disease, or unspecified chronic kidney disease: Secondary | ICD-10-CM | POA: Diagnosis not present

## 2022-01-21 DIAGNOSIS — E785 Hyperlipidemia, unspecified: Secondary | ICD-10-CM | POA: Diagnosis not present

## 2022-01-21 DIAGNOSIS — E119 Type 2 diabetes mellitus without complications: Secondary | ICD-10-CM | POA: Diagnosis not present

## 2022-01-29 DIAGNOSIS — Z0000 Encounter for general adult medical examination without abnormal findings: Secondary | ICD-10-CM | POA: Diagnosis not present

## 2022-02-19 DIAGNOSIS — E785 Hyperlipidemia, unspecified: Secondary | ICD-10-CM | POA: Diagnosis not present

## 2022-02-19 DIAGNOSIS — I10 Essential (primary) hypertension: Secondary | ICD-10-CM | POA: Diagnosis not present

## 2022-03-16 ENCOUNTER — Other Ambulatory Visit: Payer: Self-pay | Admitting: Internal Medicine

## 2022-03-22 DIAGNOSIS — M159 Polyosteoarthritis, unspecified: Secondary | ICD-10-CM | POA: Diagnosis not present

## 2022-03-22 DIAGNOSIS — I10 Essential (primary) hypertension: Secondary | ICD-10-CM | POA: Diagnosis not present

## 2022-03-22 DIAGNOSIS — E785 Hyperlipidemia, unspecified: Secondary | ICD-10-CM | POA: Diagnosis not present

## 2022-04-01 ENCOUNTER — Ambulatory Visit: Payer: Medicare Other | Admitting: Allergy

## 2022-05-04 ENCOUNTER — Other Ambulatory Visit: Payer: Self-pay | Admitting: Internal Medicine

## 2022-05-22 DIAGNOSIS — E785 Hyperlipidemia, unspecified: Secondary | ICD-10-CM | POA: Diagnosis not present

## 2022-05-22 DIAGNOSIS — I10 Essential (primary) hypertension: Secondary | ICD-10-CM | POA: Diagnosis not present

## 2022-05-31 DIAGNOSIS — I10 Essential (primary) hypertension: Secondary | ICD-10-CM | POA: Diagnosis not present

## 2022-05-31 DIAGNOSIS — Z23 Encounter for immunization: Secondary | ICD-10-CM | POA: Diagnosis not present

## 2022-05-31 DIAGNOSIS — D649 Anemia, unspecified: Secondary | ICD-10-CM | POA: Diagnosis not present

## 2022-05-31 DIAGNOSIS — J4530 Mild persistent asthma, uncomplicated: Secondary | ICD-10-CM | POA: Diagnosis not present

## 2022-06-13 ENCOUNTER — Other Ambulatory Visit: Payer: Self-pay | Admitting: Internal Medicine

## 2022-06-18 ENCOUNTER — Telehealth: Payer: Self-pay | Admitting: Internal Medicine

## 2022-06-22 DIAGNOSIS — I10 Essential (primary) hypertension: Secondary | ICD-10-CM | POA: Diagnosis not present

## 2022-06-22 DIAGNOSIS — E785 Hyperlipidemia, unspecified: Secondary | ICD-10-CM | POA: Diagnosis not present

## 2022-06-22 DIAGNOSIS — M159 Polyosteoarthritis, unspecified: Secondary | ICD-10-CM | POA: Diagnosis not present

## 2022-06-28 ENCOUNTER — Other Ambulatory Visit: Payer: Self-pay | Admitting: Family Medicine

## 2022-06-28 DIAGNOSIS — Z1231 Encounter for screening mammogram for malignant neoplasm of breast: Secondary | ICD-10-CM

## 2022-07-02 NOTE — Telephone Encounter (Signed)
Will close encounter

## 2022-07-06 ENCOUNTER — Ambulatory Visit
Admission: RE | Admit: 2022-07-06 | Discharge: 2022-07-06 | Disposition: A | Discharge status: Home / Self Care | Payer: Medicare Other | Source: Ambulatory Visit | Attending: Family Medicine | Admitting: Family Medicine

## 2022-07-06 ENCOUNTER — Other Ambulatory Visit: Payer: Self-pay | Admitting: Family Medicine

## 2022-07-06 ENCOUNTER — Ambulatory Visit: Payer: Medicare Other

## 2022-07-06 DIAGNOSIS — Z1231 Encounter for screening mammogram for malignant neoplasm of breast: Secondary | ICD-10-CM

## 2022-07-24 IMAGING — MG MM DIGITAL SCREENING BILAT W/ TOMO AND CAD
8 series · 9 of 24 positions shown · non-contrast
Comparison: Previous exam(s).

CLINICAL DATA: Screening.

EXAM:
DIGITAL SCREENING BILATERAL MAMMOGRAM WITH TOMOSYNTHESIS AND CAD
TECHNIQUE: Bilateral screening digital craniocaudal and mediolateral oblique
mammograms were obtained. Bilateral screening digital breast
tomosynthesis was performed. The images were evaluated with
computer-aided detection.

[R MLO synth-2D]
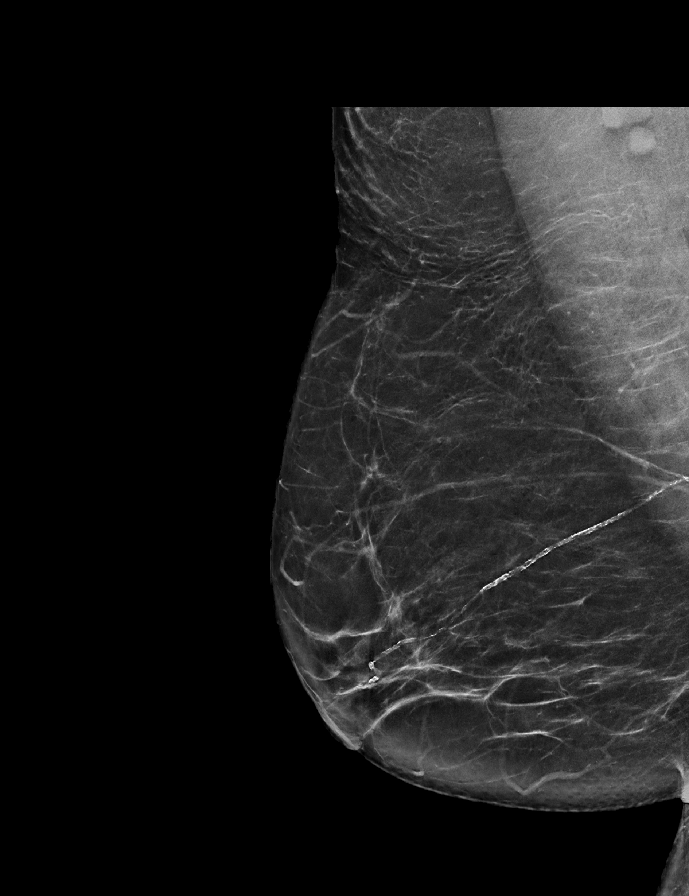

[L MLO synth-2D]
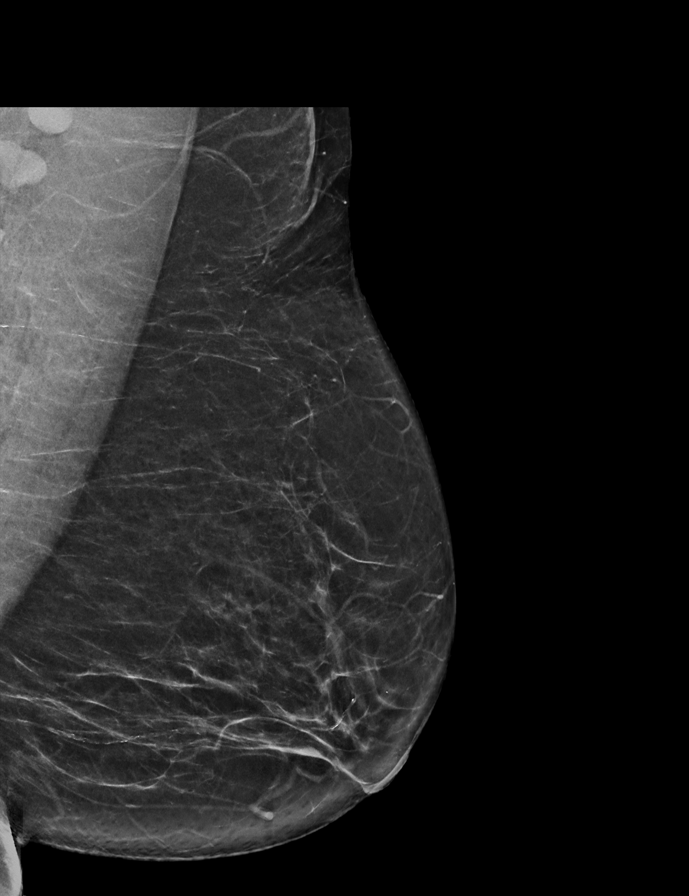

[L CC synth-2D]
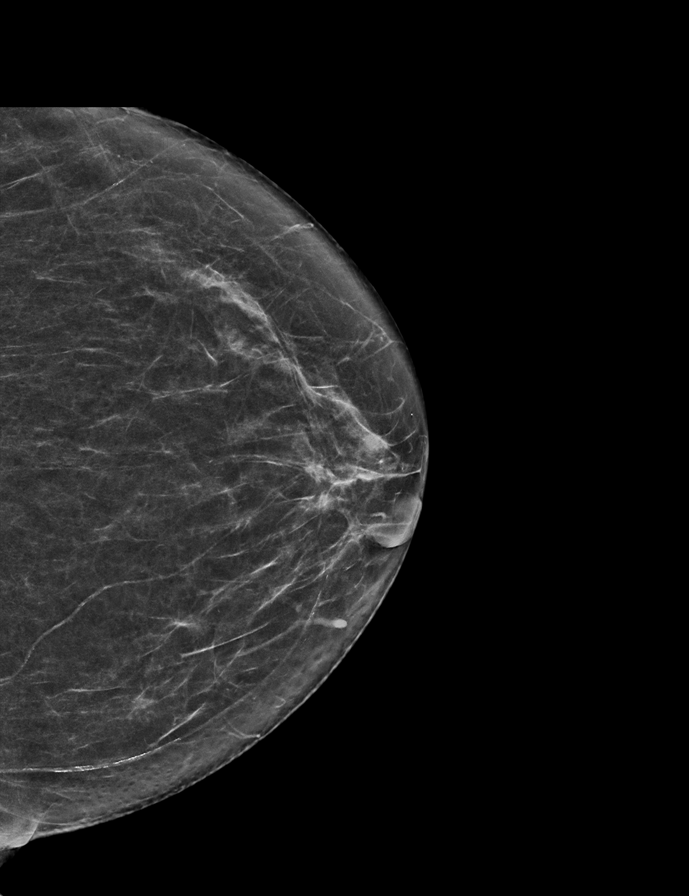

[R CC synth-2D]
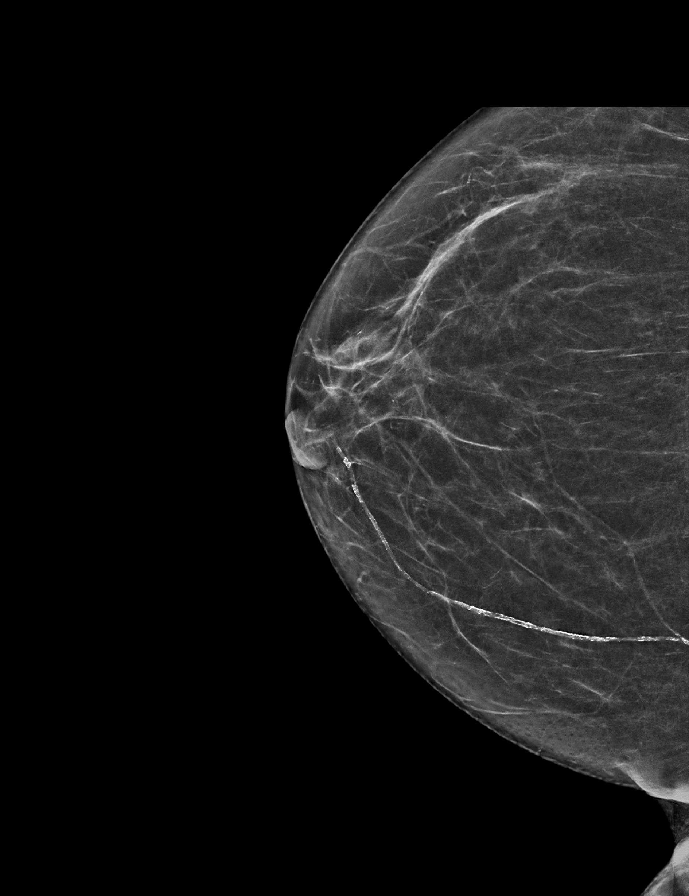

[R CC tomo · 2 of 58 frames shown]
[frame 19/58]
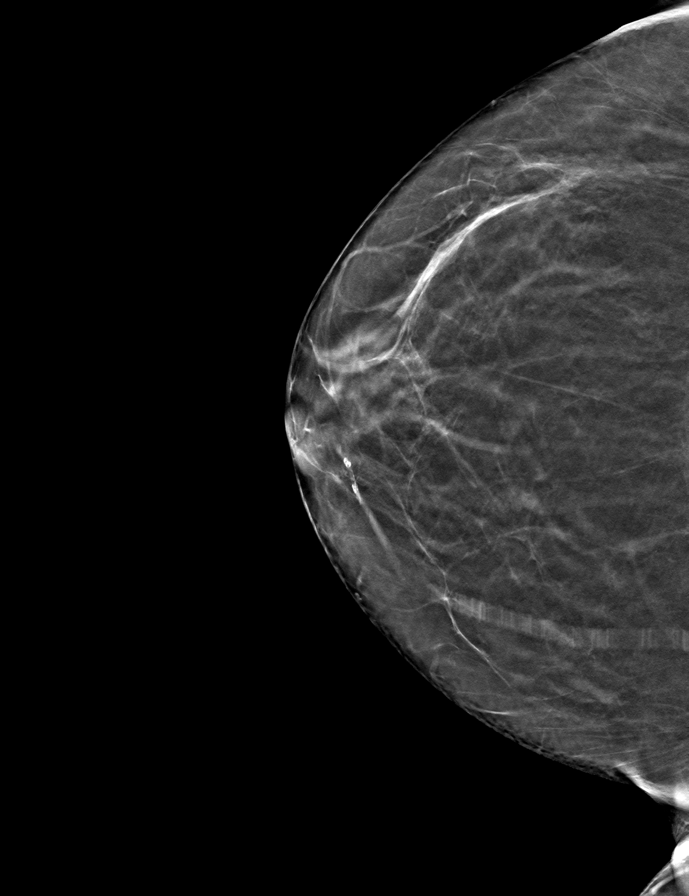
[frame 29/58]
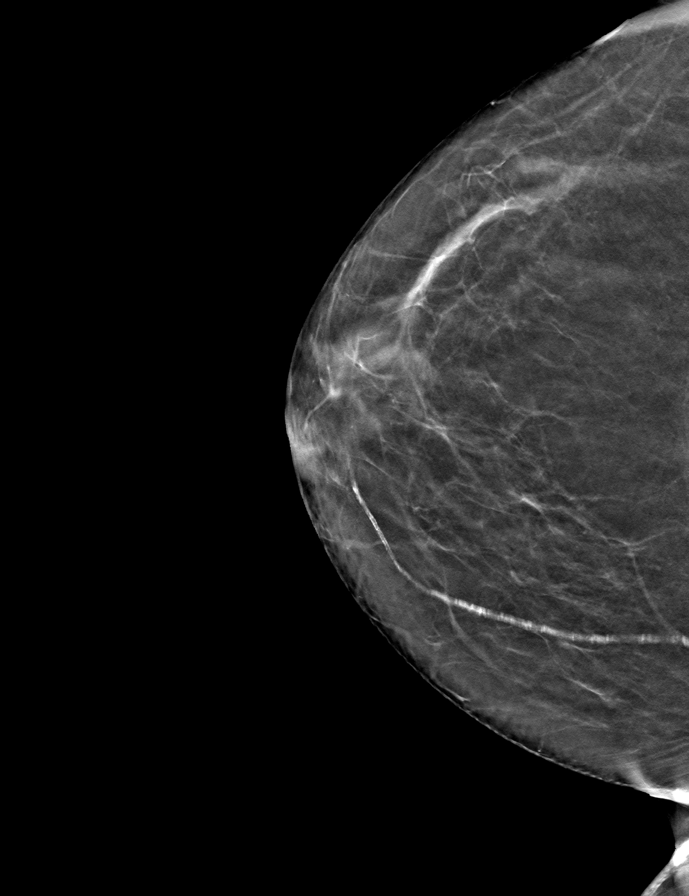

[L MLO tomo · tomo slice 35/69.0]
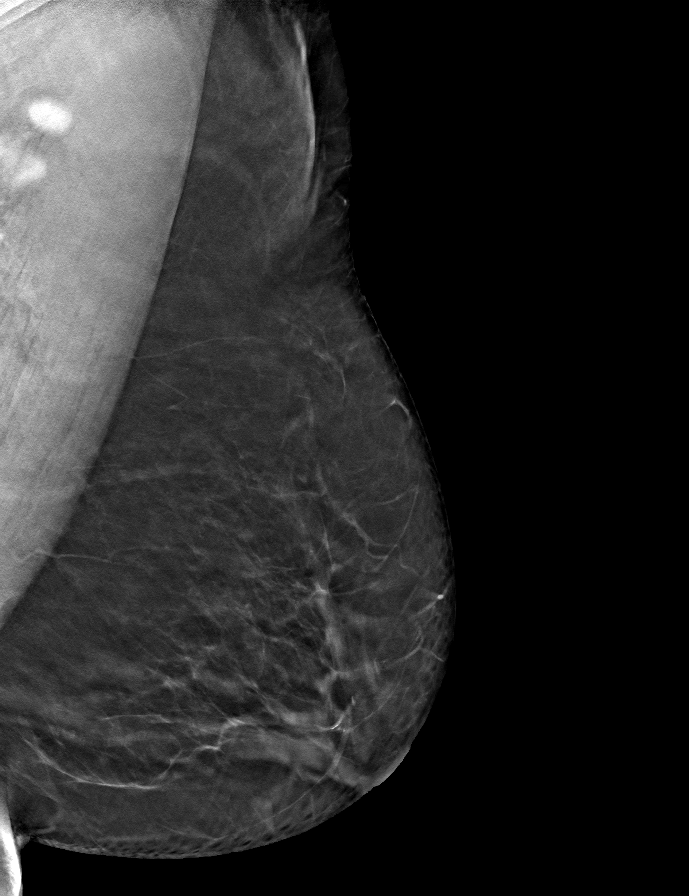

[R MLO tomo · tomo slice 37/72.0]
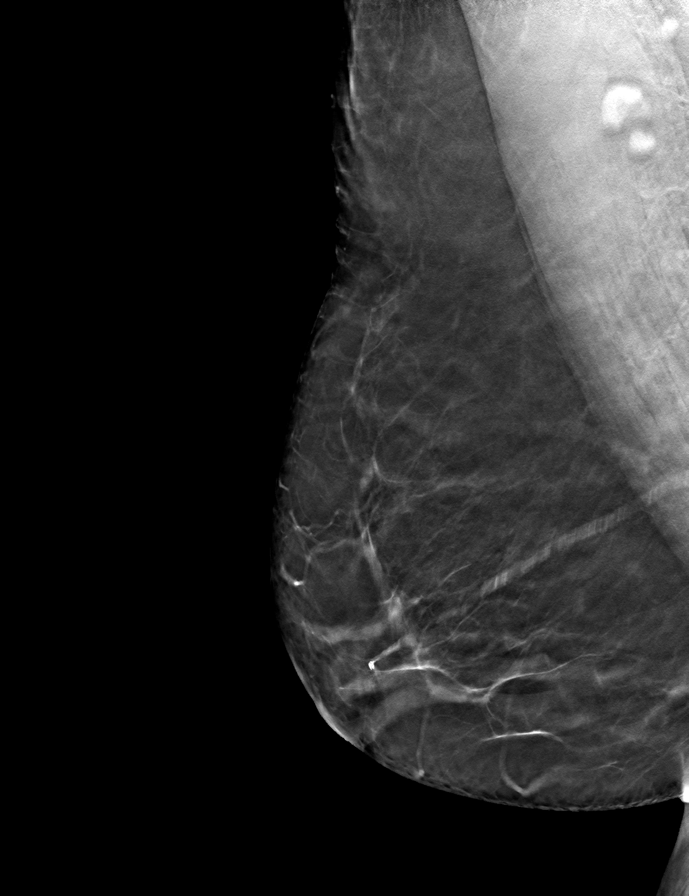

[L CC tomo · tomo slice 31/62.0]
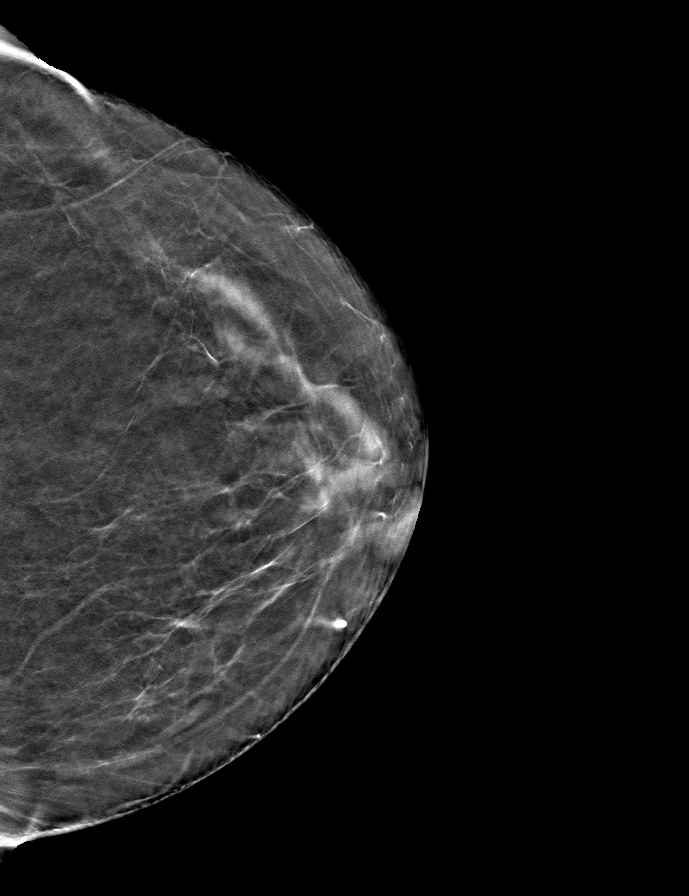

[9 of 24 positions shown; findings below may reference images not displayed]

ACR Breast Density Category b: There are scattered areas of
fibroglandular density.
FINDINGS: There are no findings suspicious for malignancy. The images were
evaluated with computer-aided detection.
IMPRESSION: No mammographic evidence of malignancy. A result letter of this
screening mammogram will be mailed directly to the patient.

RECOMMENDATION:
Screening mammogram in one year. (Code:[id number])

BI-RADS CATEGORY  1: Negative.

## 2022-08-03 ENCOUNTER — Telehealth: Payer: Self-pay | Admitting: Internal Medicine

## 2022-08-03 NOTE — Telephone Encounter (Signed)
Spoke to pt and pt states she gave me the wrong info on the pharmacy and it's not [facility] it's [facility]. So, I called [facility] and spoke to a [facility] Rep. and was informed they only needed the provider portion of the [place] form. I had Dr Melvyn Novas sign and I faxed it to [facility]. Nothing further needed.

## 2022-08-03 NOTE — Telephone Encounter (Signed)
Patient would like the nurse to call regarding her medications.  She stated she is trying to get them sent through a mail delivery and needs some paperwork submitted.  Please call to discuss at [phone]

## 2022-08-03 NOTE — Telephone Encounter (Signed)
I spoke to pt and and she informed me about her trying to get [employer] ordered from [employer]. And they have been trying to fax [place] a form. I didn't see any form or communication that a form was sent. I also spoke to an [facility] rep and she stated the pt need to register with them first and they will sent [place] an electronic request ro refill Breztri for pt. Waiting to speak to pt again to relay message.

## 2022-08-03 NOTE — Telephone Encounter (Signed)
Patient stated she just missed a call from the nurse.  Please call patient back at [phone]

## 2022-08-11 ENCOUNTER — Telehealth: Payer: Self-pay

## 2022-08-11 NOTE — Telephone Encounter (Signed)
After looking into what exactly "[facility]" is, it became very clear that the pt has been (for lack of a better word) scammed into paying for services that we here at the clinic offer free of charge. This company essentially acts as a Tax inspector between the pt and PAP programs, filling out and sending in the paperwork in exchange for paying an ongoing monthly fee "as low as $39.95" --which they justify by stating that:    In reality, the paperwork for PAP only needs to be handled once a year--anyone with access to a computer can look up and print off and send in the application on their own. Additionally, staff here at the clinic are trained to assist with the PAP process for non-spec medications as well. Once approved, the pt simply receives their medication from the company with no additional charges required.   Contacted pt to discuss this. She informs me that she is actually being charged *$50* per month and is under the impression that her Judithann Sauger is coming directly from [facility] rather than [employer]. Advised pt on the situation and what steps she should take to remove herself from this. She states that she was initially directed to this company by a member of [employer]. I reached out to them and (after great difficulty) finally was able to speak to a rep to discuss their practices. At first the rep acknowledged that they use "patient assistance programs like [facility] all the time" but then seemingly backtracked, stating that she had never heard of them once I explained how their services were essentially scamming patients into paying a monthly charge for perceived "services". Regardless, I advised that she spread the word around to her coworkers to no longer direct patients to services like these.

## 2022-08-11 NOTE — Telephone Encounter (Signed)
Pt returned call to me after contacting [facility]. She informed the rep (named "Deon") that she simply could not afford to continue paying $50 per month and would like to discontinue services, choosing to leave the fact that I had contacted her and continuing to feign ignorance that she was caught up in anything untoward. She stated her reasoning for this was her belief that I had reached out to her in confidence to advise her on matters that were in her best interest.  The rep first states that if she were to cancel now then she would have to "redo all the paperwork again", but then offers her 2 "free months of service" and states that they will lower her monthly charge to $19.95 starting in February. The pt states she agrees to this, with full knowledge that she will contact them again in January to once again state that she cannot afford the monthly premium. I reassured the patient that she already had an approval on file until 2024, so no additional paperwork would be needed until next December; however even if she was forced to resubmit it for whatever reason that we would help her every step of the way. Pt verbalized understanding and expressed thanks.  Will keep this encounter open and push out to February for f/u.

## 2022-09-05 ENCOUNTER — Other Ambulatory Visit: Payer: Self-pay | Admitting: Internal Medicine

## 2022-09-21 DIAGNOSIS — M159 Polyosteoarthritis, unspecified: Secondary | ICD-10-CM | POA: Diagnosis not present

## 2022-09-21 DIAGNOSIS — I10 Essential (primary) hypertension: Secondary | ICD-10-CM | POA: Diagnosis not present

## 2022-09-21 DIAGNOSIS — E785 Hyperlipidemia, unspecified: Secondary | ICD-10-CM | POA: Diagnosis not present

## 2022-10-07 DIAGNOSIS — E785 Hyperlipidemia, unspecified: Secondary | ICD-10-CM | POA: Diagnosis not present

## 2022-10-07 DIAGNOSIS — I10 Essential (primary) hypertension: Secondary | ICD-10-CM | POA: Diagnosis not present

## 2022-10-07 DIAGNOSIS — E119 Type 2 diabetes mellitus without complications: Secondary | ICD-10-CM | POA: Diagnosis not present

## 2022-10-07 DIAGNOSIS — M159 Polyosteoarthritis, unspecified: Secondary | ICD-10-CM | POA: Diagnosis not present

## 2022-10-08 DIAGNOSIS — D649 Anemia, unspecified: Secondary | ICD-10-CM | POA: Diagnosis not present

## 2022-10-08 DIAGNOSIS — I10 Essential (primary) hypertension: Secondary | ICD-10-CM | POA: Diagnosis not present

## 2022-10-08 DIAGNOSIS — R7303 Prediabetes: Secondary | ICD-10-CM | POA: Diagnosis not present

## 2022-10-08 DIAGNOSIS — N189 Chronic kidney disease, unspecified: Secondary | ICD-10-CM | POA: Diagnosis not present

## 2022-12-21 DIAGNOSIS — I129 Hypertensive chronic kidney disease with stage 1 through stage 4 chronic kidney disease, or unspecified chronic kidney disease: Secondary | ICD-10-CM | POA: Diagnosis not present

## 2022-12-21 DIAGNOSIS — N189 Chronic kidney disease, unspecified: Secondary | ICD-10-CM | POA: Diagnosis not present

## 2023-01-18 ENCOUNTER — Other Ambulatory Visit: Payer: Self-pay | Admitting: Internal Medicine

## 2023-01-19 ENCOUNTER — Telehealth: Payer: Self-pay | Admitting: Internal Medicine

## 2023-01-19 NOTE — Telephone Encounter (Signed)
Pt last seen over a year ago. Would like Albuterol refill according to [facility]. Adv I would send back request and brought lapse to Pharm attention.

## 2023-01-21 NOTE — Telephone Encounter (Signed)
Spoke with patient. Advised albuterol inhaler was sent to pharmacy. NFN

## 2023-02-11 ENCOUNTER — Telehealth: Payer: Self-pay | Admitting: Internal Medicine

## 2023-02-11 ENCOUNTER — Other Ambulatory Visit: Payer: Self-pay

## 2023-02-11 MED ORDER — ALBUTEROL SULFATE HFA 108 (90 BASE) MCG/ACT IN AERS: 2.0000 | INHALATION_SPRAY | Freq: Four times a day (QID) | RESPIRATORY_TRACT | 0 refills | Status: DC | PRN

## 2023-02-11 NOTE — Telephone Encounter (Signed)
Spoke with patient. Advised refill of Albuterol has been sent to pharmacy and to please keep upcoming apt with Dr. Sherene Sires for future refills. Suzanne Harvey verbalized understanding. NFn

## 2023-02-11 NOTE — Telephone Encounter (Signed)
Patient states needs refill for Albuterol inhaler. Pharmacy is [facility]. Patient phone number is [phone].

## 2023-02-11 NOTE — Telephone Encounter (Signed)
Spoke with patient. Advised refill of Albuterol has been sent to pharmacy and to please keep upcoming apt with Dr. Wert for future refills. She verbalized understanding. NFn 

## 2023-03-09 ENCOUNTER — Other Ambulatory Visit: Payer: Self-pay | Admitting: Internal Medicine

## 2023-04-06 ENCOUNTER — Encounter: Payer: Self-pay | Admitting: Internal Medicine

## 2023-04-06 ENCOUNTER — Ambulatory Visit: Payer: Medicare Other | Admitting: Internal Medicine

## 2023-04-06 VITALS — BP 128/64 | HR 67 | Temp 98.0°F | Ht 61.0 in | Wt 139.4 lb

## 2023-04-06 DIAGNOSIS — J4550 Severe persistent asthma, uncomplicated: Secondary | ICD-10-CM

## 2023-04-06 MED ORDER — ALBUTEROL SULFATE HFA 108 (90 BASE) MCG/ACT IN AERS: 2.0000 | INHALATION_SPRAY | RESPIRATORY_TRACT | 1 refills | Status: DC | PRN

## 2023-04-06 MED ORDER — BREZTRI AEROSPHERE 160-9-4.8 MCG/ACT IN AERO: INHALATION_SPRAY | RESPIRATORY_TRACT | 11 refills | Status: DC

## 2023-04-06 NOTE — Progress Notes (Signed)
Patient ID: Suzanne Harvey, female   DOB: October 18, 1945   MRN: [id number]    Brief patient profile:  21  yobf never smoker  with  severe chronic asthma (since childhood)  with an irreversible component improved to FEV1 70% 09/30/2011    History of Present Illness  03/16/2013 f/u ov/ re asthma, confused with instructions/ no longer using med calendar provided Chief Complaint  Patient presents with   Follow-up    last seen 09/2011. Pt reports she has good and bad days w/ breathing. Denies any cough, no wheezing, no chest tx  variable sob but not needing any saba, using qvar bid and symbicort 160 once a day.  rec Symbicort 160 Take 2 puffs first thing in am and then another 2 puffs about 12 hours later.  Qvar 80 2 puffs immediately after am dose of symbicort (only in am, only need the 15 min delay if short of breath or tight in am) Only use your albuterol (xopenex) as a rescue medication      12/29/2020  f/u ov/ re: severe chronic asthma  Last pred 3 weeks prior on symbicort 160/singulair / freq saba Chief Complaint  Patient presents with   Follow-up    PFT done today. She states her breathing is "average" today. She is using her albuterol inhaler 2 x per day.    Dyspnea:  Prefers  Bicycle x 25 min 3 x weekly denies ever being  Cough: none  Sleeping: flat bed/ one pillow  SABA use: avg 2 x daily  02: none Covid status:   vax x 3  Rec Change symbicort  160 to breztri Take 2 puffs first thing in am and then another 2 puffs about 12 hours later.  Work on inhaler technique:  We will calling you to refer to Dr Kathyrn Lass allergy / [facility]> saw [facility] 03/13/21  > just rec flonase     04/06/2023  f/u ov/ re: severe chronic asthma  maint on breztri  2bid   Chief Complaint  Patient presents with   Follow-up    Doing well.  No sx noted.  Dyspnea:  bicycle 30 min three times per week s sob  Cough: none Sleeping: level bed one pillow no resp cc  SABA use: 2-3 x per week  02:  none    No obvious day to day or daytime variability or assoc excess/ purulent sputum or mucus plugs or hemoptysis or cp or chest tightness, subjective wheeze or overt sinus or hb symptoms.    . Also denies any obvious fluctuation of symptoms with weather or environmental changes or other aggravating or alleviating factors except as outlined above   No unusual exposure hx or h/o childhood pna  or knowledge of premature birth.  Current Allergies, Complete Past Medical History, Past Surgical History, Family History, and Social History were reviewed in [employer] record.  ROS  The following are not active complaints unless bolded Hoarseness, sore throat, dysphagia, dental problems, itching, sneezing,  nasal congestion or discharge of excess mucus or purulent secretions, ear ache,   fever, chills, sweats, unintended wt loss or wt gain, classically pleuritic or exertional cp,  orthopnea pnd or arm/hand swelling  or leg swelling, presyncope, palpitations, abdominal pain, anorexia, nausea, vomiting, diarrhea  or change in bowel habits or change in bladder habits, change in stools or change in urine, dysuria, hematuria,  rash, arthralgias, visual complaints, headache, numbness, weakness or ataxia or problems with walking or coordination,  change  in mood or  memory.        Current Meds  Medication Sig   albuterol (VENTOLIN HFA) 108 (90 Base) MCG/ACT inhaler Inhale 2 puffs into the lungs every 6 (six) hours as needed for wheezing or shortness of breath.   azelastine (ASTELIN) 0.1 % nasal spray 1-2 sprays each nostril twice a day as needed for nasal drainage and throat clearing.   Budeson-Glycopyrrol-Formoterol (BREZTRI AEROSPHERE) 160-9-4.8 MCG/ACT AERO INHALE TWO PUFFS BY MOUTH INTO LUNGS in THE morning AND AT bedtime   ferrous sulfate 325 (65 FE) MG tablet Take 325 mg by mouth daily with breakfast.   fluticasone (FLONASE) 50 MCG/ACT nasal spray PLACE 1 SPRAY INTO BOTH NOSTRILS  DAILY.   montelukast (SINGULAIR) 10 MG tablet TAKE ONE TABLET BY MOUTH EVERYDAY AT BEDTIME   Multiple Vitamin (MULTIVITAMIN) capsule Take 1 capsule by mouth daily.   rosuvastatin (CRESTOR) 10 MG tablet Take 10 mg by mouth daily.   valsartan-hydrochlorothiazide (DIOVAN-HCT) 80-12.5 MG per tablet Take 1 tablet by mouth daily.           Past Medical History:  GASTROESOPHAGEAL REFLUX DISEASE (ICD-530.81)  ASTHMA (ICD-493.90)  - pft's 08/11/2007 FEV1 1.15 ratio 43% with 13% improvement after bronchodilators and nl DLC0  - nl alpha1AT 05/13/2006       Objective:   Physical Exam  Wts  04/06/2023    139 01/11/2022    152 07/08/2021  154 12/29/2020     151  09/26/2020    150 08/02/2018 154 04/19/2016   156     03/16/2013  164    Vital signs reviewed  04/06/2023  - Note at rest 02 sats  100% on RA   General appearance:    amb bf nad     HEENT : Oropharynx  clear/no thrush      NECK :  without  apparent JVD/ palpable Nodes/TM    LUNGS: no acc muscle use,  Min barrel  contour chest wall with bilateral  slightly decreased bs s audible wheeze and  without cough on insp or exp maneuvers and min  Hyperresonant  to  percussion bilaterally    CV:  RRR  no s3 or murmur or increase in P2, and no edema   ABD:  soft and nontender   MS:  Nl gait/ ext warm without deformities Or obvious joint restrictions  calf tenderness, cyanosis or clubbing     SKIN: warm and dry without lesions    NEURO:  alert, approp, nl sensorium with  no motor or cerebellar deficits apparent.               Assessment:

## 2023-04-06 NOTE — Assessment & Plan Note (Addendum)
Onset in childhood     - PFT's 09/30/2011  FEV1  1.34 (70%) ratio 44 and no change p B2, DLCO 62 corrects to 95%    - PFT's 04/03/2014  FEV1 0.91 (60%) ratio 42 and no change p B2 and DLCO 90%   - FENO 04/19/2016  =   15 on symbicort 160/ qvar 80 2bid each one > no change rx  - PFT's 03/09/2019  FEV1 0.95 (68 % ) ratio 0.44  p 1 % improvement from saba p nothing  prior to study with DLCO  13.51 (81%) corrects to 3.84 (90%)  for alv volume and FV curve typical airflow obst  - 09/26/2020  After extensive coaching inhaler device,  effectiveness =    75% (short Ti) Plan C = Crisis (only if Plan B stops working well and needing more than usual)  - Prednisone 10 mg take  4 each am x 2 days,   2 each am x 2 days,  1 each am x 2 days and stop  - PFT's  12/29/2020  FEV1 1.05 (74 % ) ratio 0.51  p 13 % improvement from saba p albuterol x one hour prior to study with DLCO  12.54 (73%) corrects to 3.40 (80%)  for alv volume and FV curve severe concavity  - 12/29/20 > changed to breztri Take 2 puffs first thing in am and then another 2 puffs about 12 hours later.   Since breztri change >>> All goals of chronic asthma control met including optimal function and elimination of symptoms with minimal need for rescue therapy.  Contingencies discussed in full including contacting this office immediately if not controlling the symptoms using the rule of two's.     F/u can by yearly, sooner prn          Each maintenance medication was reviewed in detail including emphasizing most importantly the difference between maintenance and prns and under what circumstances the prns are to be triggered using an action plan format where appropriate.  Total time for H and P, chart review, counseling, reviewing hfa device(s) and generating customized AVS unique to this office visit / same day charting = 25 min

## 2023-04-06 NOTE — Patient Instructions (Addendum)
No change in medications   Keep up the exercise   Please schedule a follow up visit in 12  months but call sooner if needed

## 2023-04-11 DIAGNOSIS — I10 Essential (primary) hypertension: Secondary | ICD-10-CM | POA: Diagnosis not present

## 2023-04-11 DIAGNOSIS — E785 Hyperlipidemia, unspecified: Secondary | ICD-10-CM | POA: Diagnosis not present

## 2023-04-11 DIAGNOSIS — D649 Anemia, unspecified: Secondary | ICD-10-CM | POA: Diagnosis not present

## 2023-04-11 DIAGNOSIS — N189 Chronic kidney disease, unspecified: Secondary | ICD-10-CM | POA: Diagnosis not present

## 2023-04-11 DIAGNOSIS — J4530 Mild persistent asthma, uncomplicated: Secondary | ICD-10-CM | POA: Diagnosis not present

## 2023-04-13 ENCOUNTER — Ambulatory Visit
Admission: EM | Admit: 2023-04-13 | Discharge: 2023-04-13 | Disposition: A | Discharge status: Home / Self Care | Payer: Medicare Other | Attending: Internal Medicine | Admitting: Internal Medicine

## 2023-04-13 ENCOUNTER — Ambulatory Visit: Payer: Self-pay

## 2023-04-13 DIAGNOSIS — J4540 Moderate persistent asthma, uncomplicated: Secondary | ICD-10-CM | POA: Diagnosis not present

## 2023-04-13 DIAGNOSIS — U071 COVID-19: Secondary | ICD-10-CM | POA: Insufficient documentation

## 2023-04-13 MED ORDER — PREDNISONE 20 MG PO TABS: ORAL_TABLET | ORAL | 0 refills | Status: DC

## 2023-04-13 MED ORDER — PROMETHAZINE-DM 6.25-15 MG/5ML PO SYRP: 2.5000 mL | ORAL_SOLUTION | Freq: Three times a day (TID) | ORAL | 0 refills | Status: DC | PRN

## 2023-04-13 MED ORDER — PAXLOVID (300/100) 20 X 150 MG & 10 X 100MG PO TBPK: ORAL_TABLET | ORAL | 0 refills | Status: DC

## 2023-04-13 NOTE — ED Triage Notes (Signed)
Pt reports she tested positive for COVID last night. Pt reports cough and runny nose x 2 days.

## 2023-04-13 NOTE — ED Provider Notes (Signed)
[facility]  Note:  This document was prepared using Conservation officer, historic buildings and may include unintentional dictation errors.  MRN: [id number] DOB: Mar 26, 1946  Subjective:   Suzanne Harvey is a 77 y.o. female presenting for 2 day history of runny and stuffy nose, coughing. No fever, chest pain, shob, wheezing. Did a COVID test and was positive. Wants a PCR test. Would like to make sure, had a lot of exposure to COVID recently. Has previously taking Paxlovid without any issues and would like to get it for COVID 19.   No current facility-administered medications for this encounter.  Current Outpatient Medications:    albuterol (VENTOLIN HFA) 108 (90 Base) MCG/ACT inhaler, Inhale 2 puffs into the lungs every 4 (four) hours as needed for wheezing or shortness of breath., Disp: 18 g, Rfl: 1   azelastine (ASTELIN) 0.1 % nasal spray, 1-2 sprays each nostril twice a day as needed for nasal drainage and throat clearing., Disp: 30 mL, Rfl: 5   Budeson-Glycopyrrol-Formoterol (BREZTRI AEROSPHERE) 160-9-4.8 MCG/ACT AERO, Take 2 puffs first thing in am and then another 2 puffs about 12 hours later., Disp: 10.7 g, Rfl: 11   ferrous sulfate 325 (65 FE) MG tablet, Take 325 mg by mouth daily with breakfast., Disp: , Rfl:    fluticasone (FLONASE) 50 MCG/ACT nasal spray, PLACE 1 SPRAY INTO BOTH NOSTRILS DAILY., Disp: 48 mL, Rfl: 3   montelukast (SINGULAIR) 10 MG tablet, TAKE ONE TABLET BY MOUTH EVERYDAY AT BEDTIME, Disp: 90 tablet, Rfl: 0   Multiple Vitamin (MULTIVITAMIN) capsule, Take 1 capsule by mouth daily., Disp: , Rfl:    rosuvastatin (CRESTOR) 10 MG tablet, Take 10 mg by mouth daily., Disp: , Rfl:    valsartan-hydrochlorothiazide (DIOVAN-HCT) 80-12.5 MG per tablet, Take 1 tablet by mouth daily., Disp: , Rfl:    Allergies  Allergen Reactions   Penicillins    Sulfonamide Derivatives     Past Medical History:  Diagnosis Date   Asthma    GERD (gastroesophageal reflux  disease)      Past Surgical History:  Procedure Laterality Date   ABDOMINAL HYSTERECTOMY Bilateral 1977   ADENOIDECTOMY     TONSILLECTOMY      Family History  Problem Relation Age of Onset   Cancer Mother        abd   Heart disease Father    Stroke Father    Allergic rhinitis Sister    Asthma Other        2 children   Breast cancer Neg Hx     Social History   Tobacco Use   Smoking status: Never   Smokeless tobacco: Never  Vaping Use   Vaping status: Never Used  Substance Use Topics   Alcohol use: Never   Drug use: Never    ROS   Objective:   Vitals: BP 135/71 (BP Location: Right Arm)   Pulse 72   Temp 98.2 F (36.8 C) (Oral)   Resp 18   SpO2 94%   Physical Exam Constitutional:      General: She is not in acute distress.    Appearance: Normal appearance. She is well-developed and normal weight. She is not ill-appearing, toxic-appearing or diaphoretic.  HENT:     Head: Normocephalic and atraumatic.     Right Ear: Tympanic membrane, ear canal and external ear normal. No drainage or tenderness. No middle ear effusion. There is no impacted cerumen. Tympanic membrane is not erythematous or bulging.     Left  Ear: Tympanic membrane, ear canal and external ear normal. No drainage or tenderness.  No middle ear effusion. There is no impacted cerumen. Tympanic membrane is not erythematous or bulging.     Nose: Nose normal. No congestion or rhinorrhea.     Mouth/Throat:     Mouth: Mucous membranes are moist. No oral lesions.     Pharynx: No pharyngeal swelling, oropharyngeal exudate, posterior oropharyngeal erythema or uvula swelling.     Tonsils: No tonsillar exudate or tonsillar abscesses.  Eyes:     General: No scleral icterus.       Right eye: No discharge.        Left eye: No discharge.     Extraocular Movements: Extraocular movements intact.     Right eye: Normal extraocular motion.     Left eye: Normal extraocular motion.     Conjunctiva/sclera:  Conjunctivae normal.  Cardiovascular:     Rate and Rhythm: Normal rate and regular rhythm.     Heart sounds: Normal heart sounds. No murmur heard.    No friction rub. No gallop.  Pulmonary:     Effort: Pulmonary effort is normal. No respiratory distress.     Breath sounds: No stridor. No wheezing, rhonchi or rales.  Chest:     Chest wall: No tenderness.  Musculoskeletal:     Cervical back: Normal range of motion and neck supple.  Lymphadenopathy:     Cervical: No cervical adenopathy.  Skin:    General: Skin is warm and dry.  Neurological:     General: No focal deficit present.     Mental Status: She is alert and oriented to person, place, and time.  Psychiatric:        Mood and Affect: Mood normal.        Behavior: Behavior normal.    Assessment and Plan :   PDMP not reviewed this encounter.  1. Clinical diagnosis of COVID-19   2. Moderate persistent asthma without complication    Recommended prednisone for her asthma. Start Paxlovid for clinical diagnosis of COVID 19, testing pending. Deferred imaging given clear cardiopulmonary exam, hemodynamically stable vital signs. Use supportive care otherwise. Counseled patient on potential for adverse effects with medications prescribed/recommended today, ER and return-to-clinic precautions discussed, patient verbalized understanding.    Wallis Bamberg, [place] 04/13/23 0102

## 2023-04-13 NOTE — Discharge Instructions (Signed)
We will notify you of your test results as they arrive and may take between about 24 hours.  I encourage you to sign up for MyChart if you have not already done so as this can be the easiest way for [place] to communicate results to you online or through a phone app.  Generally, we only contact you if it is a positive test result.  In the meantime, if you develop worsening symptoms including fever, chest pain, shortness of breath despite our current treatment plan then please report to the emergency room as this may be a sign of worsening status from possible viral infection.  Otherwise, we will manage this as a viral syndrome. For sore throat or cough try using a honey-based tea. Use 3 teaspoons of honey with juice squeezed from half lemon. Place shaved pieces of ginger into 1/2-1 cup of water and warm over stove top. Then mix the ingredients and repeat every 4 hours as needed. Please take Tylenol 650mg  every 6 hours for aches and pains, fevers. Hydrate very well with at least 2 liters of water. Eat light meals such as soups to replenish electrolytes and soft fruits, veggies. Start an antihistamine like Zyrtec (5mg  daily) for postnasal drainage, sinus congestion.  You can take this together with prednisone which I am prescribing today, albuterol Breztri, Singulair.  Use the cough medications as needed.   If your COVID test is positive, we will know tomorrow and you can start taking Paxlovid (nirmatrelvir &ritonavir) for 5 days. Do not take Crestor (rosuvastatin) and Paxlovid together.

## 2023-04-14 ENCOUNTER — Ambulatory Visit: Payer: Self-pay

## 2023-04-14 ENCOUNTER — Telehealth: Payer: Self-pay | Admitting: Urgent Care

## 2023-04-14 LAB — SARS CORONAVIRUS 2 (TAT 6-24 HRS): SARS Coronavirus 2: POSITIVE — AB

## 2023-04-14 MED ORDER — PREDNISONE 20 MG PO TABS: ORAL_TABLET | ORAL | 0 refills | Status: AC

## 2023-04-14 MED ORDER — PAXLOVID (300/100) 20 X 150 MG & 10 X 100MG PO TBPK: ORAL_TABLET | ORAL | 0 refills | Status: AC

## 2023-04-14 MED ORDER — PROMETHAZINE-DM 6.25-15 MG/5ML PO SYRP: 2.5000 mL | ORAL_SOLUTION | Freq: Three times a day (TID) | ORAL | 0 refills | Status: AC | PRN

## 2023-04-14 NOTE — Telephone Encounter (Signed)
Prescriptions resent to her her new pharmacy.

## 2023-04-15 ENCOUNTER — Telehealth: Payer: Self-pay | Admitting: Internal Medicine

## 2023-04-15 NOTE — Telephone Encounter (Signed)
Needs singular [facility] to [facility] on [address]

## 2023-04-22 MED ORDER — MONTELUKAST SODIUM 10 MG PO TABS: 10.0000 mg | ORAL_TABLET | Freq: Every day | ORAL | 3 refills | Status: DC

## 2023-04-22 NOTE — Telephone Encounter (Signed)
Singular refill sent.

## 2023-05-16 DIAGNOSIS — M8588 Other specified disorders of bone density and structure, other site: Secondary | ICD-10-CM | POA: Diagnosis not present

## 2023-05-16 DIAGNOSIS — E349 Endocrine disorder, unspecified: Secondary | ICD-10-CM | POA: Diagnosis not present

## 2023-05-28 ENCOUNTER — Other Ambulatory Visit: Payer: Self-pay | Admitting: Internal Medicine

## 2023-07-24 ENCOUNTER — Other Ambulatory Visit: Payer: Self-pay | Admitting: Internal Medicine

## 2023-09-24 ENCOUNTER — Other Ambulatory Visit: Payer: Self-pay | Admitting: Internal Medicine

## 2023-11-19 ENCOUNTER — Other Ambulatory Visit: Payer: Self-pay | Admitting: Internal Medicine

## 2023-12-05 DIAGNOSIS — E785 Hyperlipidemia, unspecified: Secondary | ICD-10-CM | POA: Diagnosis not present

## 2023-12-05 DIAGNOSIS — N189 Chronic kidney disease, unspecified: Secondary | ICD-10-CM | POA: Diagnosis not present

## 2023-12-05 DIAGNOSIS — D649 Anemia, unspecified: Secondary | ICD-10-CM | POA: Diagnosis not present

## 2023-12-05 DIAGNOSIS — R7303 Prediabetes: Secondary | ICD-10-CM | POA: Diagnosis not present

## 2023-12-05 DIAGNOSIS — E559 Vitamin D deficiency, unspecified: Secondary | ICD-10-CM | POA: Diagnosis not present

## 2023-12-06 DIAGNOSIS — D649 Anemia, unspecified: Secondary | ICD-10-CM | POA: Diagnosis not present

## 2023-12-06 DIAGNOSIS — N189 Chronic kidney disease, unspecified: Secondary | ICD-10-CM | POA: Diagnosis not present

## 2023-12-06 DIAGNOSIS — M810 Age-related osteoporosis without current pathological fracture: Secondary | ICD-10-CM | POA: Diagnosis not present

## 2023-12-06 DIAGNOSIS — I129 Hypertensive chronic kidney disease with stage 1 through stage 4 chronic kidney disease, or unspecified chronic kidney disease: Secondary | ICD-10-CM | POA: Diagnosis not present

## 2023-12-08 ENCOUNTER — Other Ambulatory Visit: Payer: Self-pay

## 2023-12-08 DIAGNOSIS — M810 Age-related osteoporosis without current pathological fracture: Secondary | ICD-10-CM

## 2023-12-14 ENCOUNTER — Encounter: Payer: Self-pay | Admitting: Family Medicine

## 2023-12-14 ENCOUNTER — Telehealth: Payer: Self-pay

## 2023-12-14 ENCOUNTER — Other Ambulatory Visit: Payer: Self-pay

## 2023-12-14 DIAGNOSIS — M810 Age-related osteoporosis without current pathological fracture: Secondary | ICD-10-CM | POA: Insufficient documentation

## 2023-12-14 NOTE — Telephone Encounter (Signed)
 Auth Submission: NO AUTH NEEDED Site of care: Site of care: [facility] Payer: [employer] medicare Medication & CPT/J Code(s) submitted: Reclast (Zolendronic acid) [id number] Route of submission (phone, fax, portal):  Phone # Fax # Auth type: Buy/Bill PB Units/visits requested: 5mg  x 1 dose Reference number:  Approval from: 12/14/23 to 08/22/24

## 2023-12-26 ENCOUNTER — Ambulatory Visit

## 2023-12-26 VITALS — BP 136/78 | HR 56 | Temp 97.9°F | Resp 16 | Ht 61.0 in | Wt 144.8 lb

## 2023-12-26 DIAGNOSIS — M810 Age-related osteoporosis without current pathological fracture: Secondary | ICD-10-CM | POA: Diagnosis not present

## 2023-12-26 MED ORDER — ACETAMINOPHEN 325 MG PO TABS
650.0000 mg | ORAL_TABLET | Freq: Once | ORAL | Status: AC
  Administered 2023-12-26: 650 mg via ORAL
  Filled 2023-12-26: qty 2

## 2023-12-26 MED ORDER — DIPHENHYDRAMINE HCL 25 MG PO CAPS
25.0000 mg | ORAL_CAPSULE | Freq: Once | ORAL | Status: AC
  Administered 2023-12-26: 25 mg via ORAL
  Filled 2023-12-26: qty 1

## 2023-12-26 MED ORDER — ZOLEDRONIC ACID 5 MG/100ML IV SOLN
5.0000 mg | Freq: Once | INTRAVENOUS | Status: AC
  Administered 2023-12-26: 5 mg via INTRAVENOUS
  Filled 2023-12-26: qty 100

## 2023-12-26 NOTE — Progress Notes (Signed)
 Diagnosis: Osteoporosis  Provider:  Phyllis Breeze MD  Procedure: IV Infusion  IV Type: Peripheral, IV Location: L Hand  Reclast (Zolendronic Acid), Dose: 5 mg  Infusion Start Time: 1029  Infusion Stop Time: 1105  Post Infusion IV Care: Observation period completed and Peripheral IV Discontinued  Discharge: Condition: Good, Destination: Home . AVS Declined  Performed by:  Lauran Pollard, LPN

## 2024-01-12 ENCOUNTER — Other Ambulatory Visit: Payer: Self-pay

## 2024-04-13 ENCOUNTER — Other Ambulatory Visit: Payer: Self-pay | Admitting: Family Medicine

## 2024-04-13 DIAGNOSIS — Z1231 Encounter for screening mammogram for malignant neoplasm of breast: Secondary | ICD-10-CM

## 2024-05-09 ENCOUNTER — Ambulatory Visit
Admission: RE | Admit: 2024-05-09 | Discharge: 2024-05-09 | Disposition: A | Discharge status: Home / Self Care | Source: Ambulatory Visit | Attending: Family Medicine | Admitting: Family Medicine

## 2024-05-09 DIAGNOSIS — N189 Chronic kidney disease, unspecified: Secondary | ICD-10-CM | POA: Diagnosis not present

## 2024-05-09 DIAGNOSIS — Z1231 Encounter for screening mammogram for malignant neoplasm of breast: Secondary | ICD-10-CM

## 2024-05-09 DIAGNOSIS — I129 Hypertensive chronic kidney disease with stage 1 through stage 4 chronic kidney disease, or unspecified chronic kidney disease: Secondary | ICD-10-CM | POA: Diagnosis not present

## 2024-05-09 DIAGNOSIS — R7303 Prediabetes: Secondary | ICD-10-CM | POA: Diagnosis not present

## 2024-05-09 DIAGNOSIS — I10 Essential (primary) hypertension: Secondary | ICD-10-CM | POA: Diagnosis not present

## 2024-05-09 DIAGNOSIS — E785 Hyperlipidemia, unspecified: Secondary | ICD-10-CM | POA: Diagnosis not present

## 2024-05-14 DIAGNOSIS — E1122 Type 2 diabetes mellitus with diabetic chronic kidney disease: Secondary | ICD-10-CM | POA: Diagnosis not present

## 2024-05-14 DIAGNOSIS — N189 Chronic kidney disease, unspecified: Secondary | ICD-10-CM | POA: Diagnosis not present

## 2024-05-14 DIAGNOSIS — I10 Essential (primary) hypertension: Secondary | ICD-10-CM | POA: Diagnosis not present

## 2024-05-14 DIAGNOSIS — D649 Anemia, unspecified: Secondary | ICD-10-CM | POA: Diagnosis not present

## 2024-05-14 DIAGNOSIS — Z23 Encounter for immunization: Secondary | ICD-10-CM | POA: Diagnosis not present

## 2024-05-14 DIAGNOSIS — I129 Hypertensive chronic kidney disease with stage 1 through stage 4 chronic kidney disease, or unspecified chronic kidney disease: Secondary | ICD-10-CM | POA: Diagnosis not present

## 2024-05-14 DIAGNOSIS — E785 Hyperlipidemia, unspecified: Secondary | ICD-10-CM | POA: Diagnosis not present

## 2024-05-24 ENCOUNTER — Other Ambulatory Visit: Payer: Self-pay | Admitting: Internal Medicine

## 2024-06-29 ENCOUNTER — Other Ambulatory Visit: Payer: Self-pay | Admitting: Internal Medicine

## 2024-06-29 NOTE — Telephone Encounter (Signed)
 Copied from CRM [id number]. Topic: Clinical - Medication Refill >> Jun 29, 2024 12:04 PM Celestine FALCON wrote: Medication: albuterol  (VENTOLIN  HFA) 108 (90 Base) MCG/ACT inhaler   Has the patient contacted their pharmacy? Yes (Agent: If no, request that the patient contact the pharmacy for the refill. If patient does not wish to contact the pharmacy document the reason why and proceed with request.) (Agent: If yes, when and what did the pharmacy advise?)  This is the patient's preferred pharmacy:  [facility] [phone] [address], [place] - [address] [place] [address] Phone: [phone] Fax: [phone]  Is this the correct pharmacy for this prescription? Yes If no, delete pharmacy and type the correct one.   Has the prescription been filled recently? No  Is the patient out of the medication? Yes  Has the patient been seen for an appointment in the last year OR does the patient have an upcoming appointment? Yes  Can we respond through MyChart? Yes  Agent: Please be advised that Rx refills may take up to 3 business days. We ask that you follow-up with your pharmacy.

## 2024-07-27 ENCOUNTER — Telehealth: Payer: Self-pay

## 2024-07-27 NOTE — Telephone Encounter (Signed)
 Pt contacted me asking about renewing her enrollment in the AZ&ME program for Breztri . I informed her that I was no longer with the Pulmonology clinic, but regardless I would route a message to Dr. Chari nurse to assist with coordinating the renewal application process. I advised that she try reaching out to the clinic directly if she has not received any kind of outreach over the next few business days. Pt verbalized understanding to all.

## 2024-07-30 NOTE — Telephone Encounter (Signed)
 Atc x1 regarding pt assistance for completed and ready for pick up OFFICE CLOSING AT 3 PM 12/8 AND 2 HR DELAY 12/9

## 2024-08-02 ENCOUNTER — Telehealth: Payer: Self-pay

## 2024-08-02 NOTE — Telephone Encounter (Signed)
 Copied from CRM [id number]. Topic: Clinical - Medication Question >> Jul 30, 2024  1:30 PM Rozanna G wrote: Reason for CRM: pt returning call about pt assistance paperwork stated if she does not make it his week she will pick the paperwork when she see Mrs. Hope on next Wednesday.   Noted

## 2024-08-08 ENCOUNTER — Encounter: Payer: Self-pay | Admitting: Primary Care

## 2024-08-08 ENCOUNTER — Telehealth: Payer: Self-pay | Admitting: Primary Care

## 2024-08-08 ENCOUNTER — Ambulatory Visit: Admitting: Primary Care

## 2024-08-08 VITALS — BP 115/67 | HR 68 | Temp 97.3°F | Ht 61.0 in | Wt 137.8 lb

## 2024-08-08 DIAGNOSIS — J4550 Severe persistent asthma, uncomplicated: Secondary | ICD-10-CM | POA: Diagnosis not present

## 2024-08-08 DIAGNOSIS — Z23 Encounter for immunization: Secondary | ICD-10-CM | POA: Diagnosis not present

## 2024-08-08 MED ORDER — BREZTRI AEROSPHERE 160-9-4.8 MCG/ACT IN AERO: 2.0000 | INHALATION_SPRAY | Freq: Two times a day (BID) | RESPIRATORY_TRACT | Status: AC

## 2024-08-08 MED ORDER — BREZTRI AEROSPHERE 160-9-4.8 MCG/ACT IN AERO: INHALATION_SPRAY | RESPIRATORY_TRACT | 11 refills | Status: DC

## 2024-08-08 MED ORDER — ALBUTEROL SULFATE HFA 108 (90 BASE) MCG/ACT IN AERS: 2.0000 | INHALATION_SPRAY | RESPIRATORY_TRACT | 2 refills | Status: AC | PRN

## 2024-08-08 MED ORDER — MONTELUKAST SODIUM 10 MG PO TABS: 10.0000 mg | ORAL_TABLET | Freq: Every day | ORAL | 3 refills | Status: AC

## 2024-08-08 NOTE — Patient Instructions (Signed)
°  VISIT SUMMARY: Today, we discussed your asthma management and addressed your concerns about medication costs. We also reviewed your vaccination history and identified that you are due for an updated pneumococcal vaccine.  YOUR PLAN: -SEVERE PERSISTENT ASTHMA: Severe persistent asthma is a long-term condition where your airways are inflamed and narrowed, causing difficulty in breathing. Your lung function is currently at 77%, and you experience shortness of breath with activities. We provided paperwork for patient assistance for Breztri  and consulted with a pharmacist for alternative inhaler options if you do not qualify for patient assistance. Your Breztri , montelukast , and rescue inhaler have been refilled with two refills each. We also checked for Breztri  samples to help manage costs.  -PNEUMOCOCCAL VACCINATION: You are due for an updated pneumococcal vaccine, which helps protect against pneumonia. You last received the pneumococcal 23 vaccine over 10 years ago and have not had the Prevnar 20 vaccine. We recommend getting the updated vaccine.  INSTRUCTIONS: Please follow up with the paperwork for patient assistance for Breztri  and check with the pharmacist for alternative inhaler options. Ensure you get the updated pneumococcal vaccine as recommended. Continue using your rescue inhaler as needed and maintain your regular exercise routine. Schedule a follow-up appointment to review your asthma management and lung function.  Follow-up 6 months with Dr. Darlean

## 2024-08-08 NOTE — Telephone Encounter (Signed)
 Patient can not afford Breztri , costing her 250 dollars a  month, are there any cheaper alternatives

## 2024-08-08 NOTE — Progress Notes (Signed)
 @Patient  ID: Suzanne Harvey, female    DOB: 01-06-1946, 78 y.o.   MRN: [id number]  Chief Complaint  Patient presents with   Asthma    Pt states she can still get short winded     Referring provider: Benjamine Aland, MD  HPI: Brief patient profile:  89  yobf never smoker  with  severe chronic asthma (since childhood)  with an irreversible component improved to FEV1 70% 09/30/2011    History of Present Illness  03/16/2013 f/u ov/Wert re asthma, confused with instructions/ no longer using med calendar provided Chief Complaint  Patient presents with   Follow-up    last seen 09/2011. Pt reports she has good and bad days w/ breathing. Denies any cough, no wheezing, no chest tx  variable sob but not needing any saba, using qvar  bid and symbicort  160 once a day.  rec Symbicort  160 Take 2 puffs first thing in am and then another 2 puffs about 12 hours later.  Qvar  80 2 puffs immediately after am dose of symbicort  (only in am, only need the 15 min delay if short of breath or tight in am) Only use your albuterol  (xopenex ) as a rescue medication      12/29/2020  f/u ov/Wert re: severe chronic asthma  Last pred 3 weeks prior on symbicort  160/singulair  / freq saba Chief Complaint  Patient presents with   Follow-up    PFT done today. She states her breathing is average today. She is using her albuterol  inhaler 2 x per day.    Dyspnea:  Prefers  Bicycle x 25 min 3 x weekly denies ever being  Cough: none  Sleeping: flat bed/ one pillow  SABA use: avg 2 x daily  02: none Covid status:   vax x 3  Rec Change symbicort   160 to breztri  Take 2 puffs first thing in am and then another 2 puffs about 12 hours later.  Work on inhaler technique:  We will calling you to refer to Dr Rowan allergy / [facility]> saw [facility] 03/13/21  > just rec flonase    04/06/2023  f/u ov/Wert re: severe chronic asthma  maint on breztri   2bid   Chief Complaint  Patient presents with   Follow-up    Doing well.  No sx  noted.  Dyspnea:  bicycle 30 min three times per week s sob  Cough: none Sleeping: level bed one pillow no resp cc  SABA use: 2-3 x per week  02: none     08/08/2024- Interim hx  Discussed the use of AI scribe software for clinical note transcription with the patient, who gave verbal consent to proceed.  History of Present Illness Suzanne Harvey is a 78 year old female with asthma who presents for a follow-up visit.  She has a long-standing history of asthma since childhood. Her pulmonary function test in 2023 showed a lung function of 77%.  She has been using Breztri  for asthma management but has been unable to afford it recently, as it costs $250 per month. She has been out of Breztri  for about two weeks and is in the process of applying for patient assistance. She has used other inhalers in the past, which have been effective.  She experiences shortness of breath with activities such as doing laundry, making the bed, and showering, but not at rest. She recovers after resting for a few minutes. She exercises regularly, doing sit-ups, walking, and using a stationary bike three to four times a week  for 30 minutes. She uses her rescue inhaler one to two times a day, primarily with activity.  No current cough or respiratory issues at night, and she sleeps with one pillow. She has not had any recent respiratory infections and has not used antibiotics or prednisone  recently. Her ACT score is 16.  She has received her flu, COVID, and RSV vaccines this year. She recalls having a pneumonia vaccine over 15 years ago and is due for an updated version.  Severe chronic asthma maintained on Breztri   Dyspnea- with exertion, recover with rest  Cough-none Sleeping-one pillow, no respiratory symptoms  SABA use- 1-2 times a day with activity  Oygen- none  ACT score 16   Allergies[1]  Immunization History  Administered Date(s) Administered   INFLUENZA, HIGH DOSE SEASONAL PF 07/29/2018    Influenza-Unspecified 05/23/2014, 06/24/2021   PFIZER Comirnaty(Gray Top)Covid-19 Tri-Sucrose Vaccine 12/30/2020   PFIZER(Purple Top)SARS-COV-2 Vaccination 10/21/2019, 11/20/2019, 06/13/2020    Past Medical History:  Diagnosis Date   Asthma    GERD (gastroesophageal reflux disease)     Tobacco History: Tobacco Use History[2] Counseling given: Not Answered   Outpatient Medications Prior to Visit  Medication Sig Dispense Refill   albuterol  (VENTOLIN  HFA) 108 (90 Base) MCG/ACT inhaler INHALE 2 PUFFS INTO THE LUNGS EVERY 4 HOURS AS NEEDED FOR WHEEZING OR SHORTNESS OF BREATH. 8.5 each 1   azelastine  (ASTELIN ) 0.1 % nasal spray 1-2 sprays each nostril twice a day as needed for nasal drainage and throat clearing. 30 mL 5   Budeson-Glycopyrrol-Formoterol  (BREZTRI  AEROSPHERE) 160-9-4.8 MCG/ACT AERO Take 2 puffs first thing in am and then another 2 puffs about 12 hours later. 10.7 g 11   ferrous sulfate 325 (65 FE) MG tablet Take 325 mg by mouth daily with breakfast.     fluticasone  (FLONASE ) 50 MCG/ACT nasal spray PLACE 1 SPRAY INTO BOTH NOSTRILS DAILY. 48 mL 3   montelukast  (SINGULAIR ) 10 MG tablet Take 1 tablet (10 mg total) by mouth at bedtime. TAKE ONE TABLET BY MOUTH EVERYDAY AT BEDTIME 90 tablet 3   Multiple Vitamin (MULTIVITAMIN) capsule Take 1 capsule by mouth daily.     nirmatrelvir & ritonavir  (PAXLOVID , 300/100,) 20 x 150 MG & 10 x 100MG  TBPK Take 2 tablets nirmtrelvir and 1 tablet ritonavir twice daily. 30 tablet 0   predniSONE  (DELTASONE ) 20 MG tablet Take 2 tablets daily with breakfast. 10 tablet 0   promethazine -dextromethorphan (PROMETHAZINE -DM) 6.25-15 MG/5ML syrup Take 2.5 mLs by mouth 3 (three) times daily as needed for cough. 100 mL 0   rosuvastatin (CRESTOR) 10 MG tablet Take 10 mg by mouth daily.     valsartan-hydrochlorothiazide (DIOVAN-HCT) 80-12.5 MG per tablet Take 1 tablet by mouth daily.     No facility-administered medications prior to visit.   Review of  Systems  Review of Systems  Constitutional: Negative.   Respiratory: Negative.    Cardiovascular: Negative.    Physical Exam  There were no vitals taken for this visit. Physical Exam Constitutional:      General: She is not in acute distress.    Appearance: Normal appearance. She is not ill-appearing.  HENT:     Head: Normocephalic and atraumatic.     Mouth/Throat:     Mouth: Mucous membranes are moist.     Pharynx: Oropharynx is clear.  Cardiovascular:     Rate and Rhythm: Normal rate and regular rhythm.  Pulmonary:     Effort: Pulmonary effort is normal.     Breath sounds: Normal breath sounds. No  wheezing, rhonchi or rales.  Skin:    General: Skin is warm and dry.  Neurological:     General: No focal deficit present.     Mental Status: She is alert and oriented to person, place, and time. Mental status is at baseline.  Psychiatric:        Mood and Affect: Mood normal.        Behavior: Behavior normal.        Thought Content: Thought content normal.        Judgment: Judgment normal.     Lab Results:  CBC    Component Value Date/Time   WBC 6.9 03/13/2021 1513   RBC 3.56 (L) 03/13/2021 1513   HGB 10.9 (L) 03/13/2021 1513   HCT 34.7 03/13/2021 1513   MCV 98 (H) 03/13/2021 1513   MCH 30.6 03/13/2021 1513   MCHC 31.4 (L) 03/13/2021 1513   RDW 12.8 03/13/2021 1513   LYMPHSABS 1.8 03/13/2021 1513   EOSABS 0.1 03/13/2021 1513   BASOSABS 0.0 03/13/2021 1513    BMET No results found for: NA, K, CL, CO2, GLUCOSE, BUN, CREATININE, CALCIUM, GFRNONAA, GFRAA  BNP No results found for: BNP  ProBNP No results found for: PROBNP  Imaging: No results found.   Assessment & Plan:    1. Asthma, severe persistent, well-controlled (HCC) (Primary)  Assessment and Plan Assessment & Plan Severe persistent asthma Obstructive component, lung function at 77%. Symptoms include exertional dyspnea, requiring rest for recovery. No nocturnal symptoms  or recent respiratory infections. ACT score of 16. Currently out of Breztri  due to cost, using rescue inhaler 1-2 times daily with activity. No oxygen use. Engages in regular exercise including walking and stationary biking. - Provided paperwork for patient assistance for Breztri . - Consulted pharmacist for alternative inhaler options. - Refilled Breztri , montelukast , and rescue inhaler with two refills. - Provide Breztri  samples   Pneumococcal vaccination Due for updated pneumococcal vaccination. Last received pneumococcal 23 vaccine over 10 years ago. No prior dose of Prevnar 20.   Almarie LELON Ferrari, NP 08/08/2024     [1]  Allergies Allergen Reactions   Penicillins    Sulfonamide Derivatives   [2]  Social History Tobacco Use  Smoking Status Never  Smokeless Tobacco Never

## 2024-08-09 ENCOUNTER — Encounter: Payer: Self-pay | Admitting: Family Medicine

## 2024-08-09 ENCOUNTER — Other Ambulatory Visit (HOSPITAL_COMMUNITY): Payer: Self-pay

## 2024-08-13 NOTE — Telephone Encounter (Signed)
 Patient has a deductible that looks like she has not met, no cheaper alternative Ask patient to apply for patient assistance

## 2024-08-14 NOTE — Telephone Encounter (Signed)
 Called and spoke with the pt and advised of BW note. Pt states she filled out Breztri  PAP paperwork at [facility] 12/17.  Routing to Ashlyn, did this get faxed?

## 2024-08-14 NOTE — Telephone Encounter (Signed)
 This should have been taken care of the day she was here for her appt.

## 2024-08-21 ENCOUNTER — Other Ambulatory Visit: Payer: Self-pay

## 2024-08-21 MED ORDER — BREZTRI AEROSPHERE 160-9-4.8 MCG/ACT IN AERO: INHALATION_SPRAY | RESPIRATORY_TRACT | 11 refills | Status: AC

## 2024-08-21 NOTE — Progress Notes (Signed)
 [facility] requesting a new RX of Breztri  sent to them. I will order this, have it printed, then signed by provider before faxing.

## 2025-01-25 ENCOUNTER — Ambulatory Visit: Admitting: Internal Medicine
